# Patient Record
Sex: Male | Born: 1935 | Race: White | Hispanic: No | Marital: Married | Smoking: Never smoker
Health system: Southern US, Community
[De-identification: ages and names within clinical notes are randomized; demographics above are authoritative.]

## PROBLEM LIST (undated history)

## (undated) DIAGNOSIS — I10 Essential (primary) hypertension: Secondary | ICD-10-CM

## (undated) HISTORY — PX: BACK SURGERY: SHX140

## (undated) HISTORY — PX: CATARACT EXTRACTION W/ INTRAOCULAR LENS IMPLANT: SHX1309

## (undated) HISTORY — PX: TONSILLECTOMY: SUR1361

---

## 2004-12-24 ENCOUNTER — Emergency Department: Payer: Self-pay | Admitting: Emergency Medicine

## 2009-11-25 ENCOUNTER — Ambulatory Visit: Payer: Self-pay | Admitting: Internal Medicine

## 2011-07-30 ENCOUNTER — Ambulatory Visit: Payer: Self-pay | Admitting: Gastroenterology

## 2011-08-02 LAB — PATHOLOGY REPORT

## 2016-07-14 ENCOUNTER — Other Ambulatory Visit: Payer: Self-pay | Admitting: Internal Medicine

## 2016-07-14 DIAGNOSIS — M5116 Intervertebral disc disorders with radiculopathy, lumbar region: Secondary | ICD-10-CM

## 2016-07-16 ENCOUNTER — Ambulatory Visit
Admission: RE | Admit: 2016-07-16 | Discharge: 2016-07-16 | Disposition: A | Discharge status: Home / Self Care | Payer: Medicare HMO | Source: Ambulatory Visit | Attending: Internal Medicine | Admitting: Internal Medicine

## 2016-07-16 DIAGNOSIS — M4807 Spinal stenosis, lumbosacral region: Secondary | ICD-10-CM | POA: Diagnosis not present

## 2016-07-16 DIAGNOSIS — M5116 Intervertebral disc disorders with radiculopathy, lumbar region: Secondary | ICD-10-CM

## 2016-07-16 DIAGNOSIS — M48061 Spinal stenosis, lumbar region without neurogenic claudication: Secondary | ICD-10-CM | POA: Insufficient documentation

## 2016-07-16 DIAGNOSIS — M5136 Other intervertebral disc degeneration, lumbar region: Secondary | ICD-10-CM | POA: Diagnosis not present

## 2016-07-16 DIAGNOSIS — M5126 Other intervertebral disc displacement, lumbar region: Secondary | ICD-10-CM | POA: Diagnosis not present

## 2016-08-11 ENCOUNTER — Encounter
Admission: RE | Admit: 2016-08-11 | Discharge: 2016-08-11 | Disposition: A | Discharge status: Home / Self Care | Payer: Medicare HMO | Source: Ambulatory Visit | Attending: Neurosurgery | Admitting: Neurosurgery

## 2016-08-11 ENCOUNTER — Ambulatory Visit
Admission: RE | Admit: 2016-08-11 | Discharge: 2016-08-11 | Disposition: A | Discharge status: Home / Self Care | Payer: Medicare HMO | Source: Ambulatory Visit | Attending: Neurosurgery | Admitting: Neurosurgery

## 2016-08-11 DIAGNOSIS — M479 Spondylosis, unspecified: Secondary | ICD-10-CM | POA: Diagnosis not present

## 2016-08-11 DIAGNOSIS — Z01810 Encounter for preprocedural cardiovascular examination: Secondary | ICD-10-CM | POA: Diagnosis not present

## 2016-08-11 DIAGNOSIS — Z01812 Encounter for preprocedural laboratory examination: Secondary | ICD-10-CM | POA: Insufficient documentation

## 2016-08-11 DIAGNOSIS — Z01818 Encounter for other preprocedural examination: Secondary | ICD-10-CM

## 2016-08-11 HISTORY — DX: Essential (primary) hypertension: I10

## 2016-08-11 LAB — URINALYSIS, COMPLETE (UACMP) WITH MICROSCOPIC
BILIRUBIN URINE: NEGATIVE
Bacteria, UA: NONE SEEN
Glucose, UA: NEGATIVE mg/dL
Hgb urine dipstick: NEGATIVE
Ketones, ur: NEGATIVE mg/dL
LEUKOCYTES UA: NEGATIVE
NITRITE: NEGATIVE
PH: 5 (ref 5.0–8.0)
Protein, ur: NEGATIVE mg/dL
RBC / HPF: NONE SEEN RBC/hpf (ref 0–5)
SPECIFIC GRAVITY, URINE: 1.02 (ref 1.005–1.030)
Squamous Epithelial / LPF: NONE SEEN

## 2016-08-11 LAB — TYPE AND SCREEN
ABO/RH(D): A NEG
Antibody Screen: NEGATIVE

## 2016-08-11 LAB — APTT: APTT: 35 s (ref 24–36)

## 2016-08-11 LAB — SURGICAL PCR SCREEN
MRSA, PCR: NEGATIVE
STAPHYLOCOCCUS AUREUS: POSITIVE — AB

## 2016-08-11 LAB — PROTIME-INR
INR: 1.06
Prothrombin Time: 13.8 seconds (ref 11.4–15.2)

## 2016-08-11 NOTE — Patient Instructions (Signed)
Your procedure is scheduled on: 08/18/16 Wed Report to Same Day Surgery 2nd floor medical mall [facility] Entrance-take elevator on left to 2nd floor.  Check in with surgery information desk.) To find out your arrival time please call [phone] between 1PM - 3PM on 08/17/16 Tues  Remember: Instructions that are not followed completely may result in serious medical risk, up to and including death, or upon the discretion of your surgeon and anesthesiologist your surgery may need to be rescheduled.    _x___ 1. Do not eat food or drink liquids after midnight. No gum chewing or                              hard candies.     __x__ 2. No Alcohol for 24 hours before or after surgery.   __x__3. No Smoking for 24 prior to surgery.   ____  4. Bring all medications with you on the day of surgery if instructed.    __x__ 5. Notify your doctor if there is any change in your medical condition     (cold, fever, infections).     Do not wear jewelry, make-up, hairpins, clips or nail polish.  Do not wear lotions, powders, or perfumes. You may wear deodorant.  Do not shave 48 hours prior to surgery. Men may shave face and neck.  Do not bring valuables to the hospital.    [facility] is not responsible for any belongings or valuables.               Contacts, dentures or bridgework may not be worn into surgery.  Leave your suitcase in the car. After surgery it may be brought to your room.  For patients admitted to the hospital, discharge time is determined by your                       treatment team.   Patients discharged the day of surgery will not be allowed to drive home.  You will need someone to drive you home and stay with you the night of your procedure.    Please read over the following fact sheets that you were given:   [facility] Preparing for Surgery and or MRSA Information   _x___ Take anti-hypertensive (unless it includes a diuretic), cardiac, seizure, asthma,     anti-reflux and  psychiatric medicines. These include:  1. amLODipine (NORVASC  2.losartan (COZAAR  3.traMADol (ULTRAM if needed  4.  5.  6.  ____Fleets enema or Magnesium Citrate as directed.   _x___ Use CHG Soap or sage wipes as directed on instruction sheet   ____ Use inhalers on the day of surgery and bring to hospital day of surgery  ____ Stop Metformin and Janumet 2 days prior to surgery.    ____ Take 1/2 of usual insulin dose the night before surgery and none on the morning     surgery.   _x___ Follow recommendations from Cardiologist, Pulmonologist or PCP regarding          stopping Aspirin, Coumadin, Pllavix ,Eliquis, Effient, or Pradaxa, and Pletal.  X____Stop Anti-inflammatories such as Advil, Aleve, Ibuprofen, Motrin, Naproxen, Naprosyn, Goodies powders or aspirin products. OK to take Tylenol and                          Celebrex.   _x___ Stop supplements until after surgery.  But may  continue Vitamin D, Vitamin B,       and multivitamin.   ____ Bring C-Pap to the hospital.

## 2016-08-11 NOTE — Pre-Procedure Instructions (Signed)
Positive staph aureus results sent to Dr. Izora Ribas for review, asked if wanted any treatment?

## 2016-08-13 DIAGNOSIS — Z888 Allergy status to other drugs, medicaments and biological substances status: Secondary | ICD-10-CM | POA: Diagnosis not present

## 2016-08-13 DIAGNOSIS — M48062 Spinal stenosis, lumbar region with neurogenic claudication: Secondary | ICD-10-CM | POA: Diagnosis present

## 2016-08-13 DIAGNOSIS — Z9841 Cataract extraction status, right eye: Secondary | ICD-10-CM | POA: Diagnosis not present

## 2016-08-13 DIAGNOSIS — E785 Hyperlipidemia, unspecified: Secondary | ICD-10-CM | POA: Diagnosis not present

## 2016-08-13 DIAGNOSIS — Z9842 Cataract extraction status, left eye: Secondary | ICD-10-CM | POA: Diagnosis not present

## 2016-08-13 DIAGNOSIS — Z79899 Other long term (current) drug therapy: Secondary | ICD-10-CM | POA: Diagnosis not present

## 2016-08-13 DIAGNOSIS — Z881 Allergy status to other antibiotic agents status: Secondary | ICD-10-CM | POA: Diagnosis not present

## 2016-08-13 DIAGNOSIS — I10 Essential (primary) hypertension: Secondary | ICD-10-CM | POA: Diagnosis not present

## 2016-08-13 DIAGNOSIS — Z7982 Long term (current) use of aspirin: Secondary | ICD-10-CM | POA: Diagnosis not present

## 2016-08-13 DIAGNOSIS — Z85828 Personal history of other malignant neoplasm of skin: Secondary | ICD-10-CM | POA: Diagnosis not present

## 2016-08-13 NOTE — Progress Notes (Addendum)
[place] NOTE  Pharmacy consult to dose vancomycin and Ancef in this 59 yoM for surgical prophylaxis  Plan: Due to body weight being < 100 kg, will give vancomycin 1000 mg IV x 1 dose for surgical prophylaxis on 5/9  Ancef 1 gm on call to OR x 1  Allergies  Allergen Reactions  . Erythromycin Other (See Comments)    Liver damage  . Indomethacin Other (See Comments)    Severe headche  . Simvastatin Other (See Comments)    Muscle pain      Labs:  Recent Labs  08/11/16 1123  APTT 35   CrCl cannot be calculated (No order found.).   Microbiology: Recent Results (from the past 720 hour(s))  Surgical pcr screen     Status: Abnormal   Collection Time: 08/11/16 11:23 AM  Result Value Ref Range Status   MRSA, PCR NEGATIVE NEGATIVE Final   Staphylococcus aureus POSITIVE (A) NEGATIVE Final    Comment:        The Xpert SA Assay (FDA approved for NASAL specimens in patients over 2 years of age), is one component of a comprehensive surveillance program.  Test performance has been validated by [facility] for patients greater than or equal to 66 year old. It is not intended to diagnose infection nor to guide or monitor treatment.     Medical History: Past Medical History:  Diagnosis Date  . Hypertension     Darrow Bussing, PharmD Pharmacy Resident 08/13/2016 4:23 PM

## 2016-08-18 ENCOUNTER — Ambulatory Visit: Payer: Medicare HMO | Admitting: Anesthesiology

## 2016-08-18 ENCOUNTER — Ambulatory Visit
Admission: RE | Admit: 2016-08-18 | Discharge: 2016-08-18 | Disposition: A | Discharge status: Home / Self Care | Payer: Medicare HMO | Source: Ambulatory Visit | Attending: Neurosurgery | Admitting: Neurosurgery

## 2016-08-18 ENCOUNTER — Ambulatory Visit: Payer: Medicare HMO

## 2016-08-18 ENCOUNTER — Encounter
Admission: RE | Disposition: A | Discharge status: Home / Self Care | Payer: Self-pay | Source: Ambulatory Visit | Attending: Neurosurgery

## 2016-08-18 DIAGNOSIS — Z9842 Cataract extraction status, left eye: Secondary | ICD-10-CM | POA: Insufficient documentation

## 2016-08-18 DIAGNOSIS — Z881 Allergy status to other antibiotic agents status: Secondary | ICD-10-CM | POA: Insufficient documentation

## 2016-08-18 DIAGNOSIS — Z79899 Other long term (current) drug therapy: Secondary | ICD-10-CM | POA: Insufficient documentation

## 2016-08-18 DIAGNOSIS — M48062 Spinal stenosis, lumbar region with neurogenic claudication: Secondary | ICD-10-CM | POA: Diagnosis not present

## 2016-08-18 DIAGNOSIS — I10 Essential (primary) hypertension: Secondary | ICD-10-CM | POA: Insufficient documentation

## 2016-08-18 DIAGNOSIS — E785 Hyperlipidemia, unspecified: Secondary | ICD-10-CM | POA: Insufficient documentation

## 2016-08-18 DIAGNOSIS — Z85828 Personal history of other malignant neoplasm of skin: Secondary | ICD-10-CM | POA: Insufficient documentation

## 2016-08-18 DIAGNOSIS — Z7982 Long term (current) use of aspirin: Secondary | ICD-10-CM | POA: Insufficient documentation

## 2016-08-18 DIAGNOSIS — Z9841 Cataract extraction status, right eye: Secondary | ICD-10-CM | POA: Insufficient documentation

## 2016-08-18 DIAGNOSIS — Z888 Allergy status to other drugs, medicaments and biological substances status: Secondary | ICD-10-CM | POA: Insufficient documentation

## 2016-08-18 DIAGNOSIS — Z419 Encounter for procedure for purposes other than remedying health state, unspecified: Secondary | ICD-10-CM

## 2016-08-18 HISTORY — PX: LUMBAR LAMINECTOMY/DECOMPRESSION MICRODISCECTOMY: SHX5026

## 2016-08-18 LAB — ABO/RH: ABO/RH(D): A NEG

## 2016-08-18 SURGERY — LUMBAR LAMINECTOMY/DECOMPRESSION MICRODISCECTOMY 2 LEVELS
Anesthesia: General | Site: Spine Lumbar | Laterality: Left | Wound class: Clean

## 2016-08-18 MED ORDER — LIDOCAINE HCL (CARDIAC) 20 MG/ML IV SOLN
INTRAVENOUS | Status: DC | PRN
  Administered 2016-08-18: 100 mg via INTRAVENOUS

## 2016-08-18 MED ORDER — LIDOCAINE HCL (PF) 2 % IJ SOLN
INTRAMUSCULAR | Status: AC
  Filled 2016-08-18: qty 2

## 2016-08-18 MED ORDER — SUGAMMADEX SODIUM 200 MG/2ML IV SOLN
INTRAVENOUS | Status: AC
  Filled 2016-08-18: qty 2

## 2016-08-18 MED ORDER — CEFAZOLIN SODIUM-DEXTROSE 1-4 GM/50ML-% IV SOLN
INTRAVENOUS | Status: AC
  Filled 2016-08-18: qty 50

## 2016-08-18 MED ORDER — CEFAZOLIN SODIUM-DEXTROSE 1-4 GM/50ML-% IV SOLN: 1.0000 g | INTRAVENOUS | Status: DC

## 2016-08-18 MED ORDER — BUPIVACAINE LIPOSOME 1.3 % IJ SUSP
INTRAMUSCULAR | Status: DC | PRN
  Administered 2016-08-18: 20 mL

## 2016-08-18 MED ORDER — OXYCODONE-ACETAMINOPHEN 5-325 MG PO TABS: 1.0000 | ORAL_TABLET | ORAL | 0 refills | Status: DC | PRN

## 2016-08-18 MED ORDER — ONDANSETRON HCL 4 MG/2ML IJ SOLN: 4.0000 mg | Freq: Once | INTRAMUSCULAR | Status: DC | PRN

## 2016-08-18 MED ORDER — SODIUM CHLORIDE 0.9 % IJ SOLN
INTRAMUSCULAR | Status: AC
  Filled 2016-08-18: qty 10

## 2016-08-18 MED ORDER — SUCCINYLCHOLINE CHLORIDE 20 MG/ML IJ SOLN
INTRAMUSCULAR | Status: AC
  Filled 2016-08-18: qty 1

## 2016-08-18 MED ORDER — GELATIN ABSORBABLE 12-7 MM EX MISC
CUTANEOUS | Status: DC | PRN
  Administered 2016-08-18: 1

## 2016-08-18 MED ORDER — GELATIN ABSORBABLE 12-7 MM EX MISC
CUTANEOUS | Status: AC
  Filled 2016-08-18: qty 1

## 2016-08-18 MED ORDER — METHOCARBAMOL 750 MG PO TABS: 750.0000 mg | ORAL_TABLET | Freq: Four times a day (QID) | ORAL | 0 refills | Status: DC | PRN

## 2016-08-18 MED ORDER — FENTANYL CITRATE (PF) 100 MCG/2ML IJ SOLN
INTRAMUSCULAR | Status: AC
  Filled 2016-08-18: qty 2

## 2016-08-18 MED ORDER — ACETAMINOPHEN 10 MG/ML IV SOLN
INTRAVENOUS | Status: AC
  Filled 2016-08-18: qty 100

## 2016-08-18 MED ORDER — PROPOFOL 10 MG/ML IV BOLUS
INTRAVENOUS | Status: AC
  Filled 2016-08-18: qty 20

## 2016-08-18 MED ORDER — FENTANYL CITRATE (PF) 100 MCG/2ML IJ SOLN
25.0000 ug | INTRAMUSCULAR | Status: DC | PRN
  Administered 2016-08-18 (×2): 25 ug via INTRAVENOUS

## 2016-08-18 MED ORDER — SODIUM CHLORIDE 0.9 % IJ SOLN
INTRAMUSCULAR | Status: AC
  Filled 2016-08-18: qty 20

## 2016-08-18 MED ORDER — BUPIVACAINE LIPOSOME 1.3 % IJ SUSP
INTRAMUSCULAR | Status: AC
  Filled 2016-08-18: qty 20

## 2016-08-18 MED ORDER — DEXAMETHASONE SODIUM PHOSPHATE 10 MG/ML IJ SOLN
INTRAMUSCULAR | Status: AC
  Filled 2016-08-18: qty 1

## 2016-08-18 MED ORDER — FAMOTIDINE 20 MG PO TABS
ORAL_TABLET | ORAL | Status: AC
  Administered 2016-08-18: 20 mg via ORAL
  Filled 2016-08-18: qty 1

## 2016-08-18 MED ORDER — EPHEDRINE SULFATE 50 MG/ML IJ SOLN
INTRAMUSCULAR | Status: AC
  Filled 2016-08-18: qty 1

## 2016-08-18 MED ORDER — LACTATED RINGERS IV SOLN
INTRAVENOUS | Status: DC
  Administered 2016-08-18 (×3): via INTRAVENOUS

## 2016-08-18 MED ORDER — PROPOFOL 10 MG/ML IV BOLUS
INTRAVENOUS | Status: DC | PRN
  Administered 2016-08-18: 140 mg via INTRAVENOUS

## 2016-08-18 MED ORDER — PHENYLEPHRINE HCL 10 MG/ML IJ SOLN
INTRAMUSCULAR | Status: DC | PRN
  Administered 2016-08-18 (×3): 100 ug via INTRAVENOUS

## 2016-08-18 MED ORDER — ROCURONIUM BROMIDE 100 MG/10ML IV SOLN
INTRAVENOUS | Status: DC | PRN
  Administered 2016-08-18: 10 mg via INTRAVENOUS
  Administered 2016-08-18: 20 mg via INTRAVENOUS
  Administered 2016-08-18: 40 mg via INTRAVENOUS

## 2016-08-18 MED ORDER — VANCOMYCIN HCL IN DEXTROSE 1-5 GM/200ML-% IV SOLN
INTRAVENOUS | Status: AC
  Filled 2016-08-18: qty 200

## 2016-08-18 MED ORDER — DEXAMETHASONE SODIUM PHOSPHATE 10 MG/ML IJ SOLN
INTRAMUSCULAR | Status: DC | PRN
  Administered 2016-08-18: 10 mg via INTRAVENOUS

## 2016-08-18 MED ORDER — FENTANYL CITRATE (PF) 100 MCG/2ML IJ SOLN
INTRAMUSCULAR | Status: AC
  Administered 2016-08-18: 25 ug via INTRAVENOUS
  Filled 2016-08-18: qty 2

## 2016-08-18 MED ORDER — METHYLPREDNISOLONE 4 MG PO TBPK: ORAL_TABLET | ORAL | 0 refills | Status: DC

## 2016-08-18 MED ORDER — VANCOMYCIN HCL IN DEXTROSE 1-5 GM/200ML-% IV SOLN
INTRAVENOUS | Status: AC
  Administered 2016-08-18: 1000 mg via INTRAVENOUS
  Filled 2016-08-18: qty 200

## 2016-08-18 MED ORDER — FAMOTIDINE 20 MG PO TABS
20.0000 mg | ORAL_TABLET | Freq: Once | ORAL | Status: AC
  Administered 2016-08-18: 20 mg via ORAL

## 2016-08-18 MED ORDER — PHENYLEPHRINE HCL 10 MG/ML IJ SOLN
INTRAMUSCULAR | Status: AC
  Filled 2016-08-18: qty 1

## 2016-08-18 MED ORDER — BACITRACIN 50000 UNITS IM SOLR
INTRAMUSCULAR | Status: DC | PRN
  Administered 2016-08-18: 50000 [IU]

## 2016-08-18 MED ORDER — BUPIVACAINE-EPINEPHRINE (PF) 0.5% -1:200000 IJ SOLN
INTRAMUSCULAR | Status: DC | PRN
  Administered 2016-08-18: 30 mL

## 2016-08-18 MED ORDER — METHYLPREDNISOLONE ACETATE 40 MG/ML IJ SUSP
INTRAMUSCULAR | Status: AC
  Filled 2016-08-18: qty 1

## 2016-08-18 MED ORDER — THROMBIN 5000 UNITS EX SOLR
CUTANEOUS | Status: AC
  Filled 2016-08-18: qty 5000

## 2016-08-18 MED ORDER — BACITRACIN 50000 UNITS IM SOLR
INTRAMUSCULAR | Status: AC
  Filled 2016-08-18: qty 1

## 2016-08-18 MED ORDER — VANCOMYCIN HCL IN DEXTROSE 1-5 GM/200ML-% IV SOLN
1000.0000 mg | INTRAVENOUS | Status: AC
  Administered 2016-08-18: 1000 mg via INTRAVENOUS

## 2016-08-18 MED ORDER — ONDANSETRON HCL 4 MG/2ML IJ SOLN
INTRAMUSCULAR | Status: AC
  Filled 2016-08-18: qty 2

## 2016-08-18 MED ORDER — METHYLPREDNISOLONE ACETATE 40 MG/ML IJ SUSP
INTRAMUSCULAR | Status: DC | PRN
  Administered 2016-08-18: 40 mg

## 2016-08-18 MED ORDER — ROCURONIUM BROMIDE 50 MG/5ML IV SOLN
INTRAVENOUS | Status: AC
  Filled 2016-08-18: qty 1

## 2016-08-18 MED ORDER — ACETAMINOPHEN 10 MG/ML IV SOLN
INTRAVENOUS | Status: DC | PRN
  Administered 2016-08-18: 1000 mg via INTRAVENOUS

## 2016-08-18 MED ORDER — FENTANYL CITRATE (PF) 100 MCG/2ML IJ SOLN
INTRAMUSCULAR | Status: DC | PRN
  Administered 2016-08-18: 100 ug via INTRAVENOUS
  Administered 2016-08-18: 50 ug via INTRAVENOUS

## 2016-08-18 MED ORDER — SUCCINYLCHOLINE CHLORIDE 20 MG/ML IJ SOLN
INTRAMUSCULAR | Status: DC | PRN
  Administered 2016-08-18: 100 mg via INTRAVENOUS

## 2016-08-18 MED ORDER — ONDANSETRON HCL 4 MG/2ML IJ SOLN
INTRAMUSCULAR | Status: DC | PRN
  Administered 2016-08-18: 4 mg via INTRAVENOUS

## 2016-08-18 MED ORDER — BUPIVACAINE-EPINEPHRINE (PF) 0.5% -1:200000 IJ SOLN
INTRAMUSCULAR | Status: AC
  Filled 2016-08-18: qty 30

## 2016-08-18 SURGICAL SUPPLY — 64 items
BAND RUBBER 3X1/6 TAN STRL (MISCELLANEOUS) IMPLANT
BLADE BOVIE TIP EXT 4 (BLADE) ×3 IMPLANT
BUR NEURO DRILL SOFT 3.0X3.8M (BURR) ×3 IMPLANT
CANISTER SUCT 1200ML W/VALVE (MISCELLANEOUS) ×3 IMPLANT
CHLORAPREP W/TINT 26ML (MISCELLANEOUS) ×6 IMPLANT
CNTNR SPEC 2.5X3XGRAD LEK (MISCELLANEOUS) ×1
CONT SPEC 4OZ STER OR WHT (MISCELLANEOUS) ×2
CONTAINER SPEC 2.5X3XGRAD LEK (MISCELLANEOUS) ×1 IMPLANT
COUNTER NEEDLE 20/40 LG (NEEDLE) ×3 IMPLANT
COVER LIGHT HANDLE STERIS (MISCELLANEOUS) ×6 IMPLANT
CUP MEDICINE 2OZ PLAST GRAD ST (MISCELLANEOUS) ×3 IMPLANT
DERMABOND ADVANCED (GAUZE/BANDAGES/DRESSINGS) ×2
DERMABOND ADVANCED .7 DNX12 (GAUZE/BANDAGES/DRESSINGS) ×1 IMPLANT
DRAPE C-ARM 42X72 X-RAY (DRAPES) ×6 IMPLANT
DRAPE LAPAROTOMY 100X77 ABD (DRAPES) ×3 IMPLANT
DRAPE MICROSCOPE SPINE 48X150 (DRAPES) ×3 IMPLANT
DRAPE POUCH INSTRU U-SHP 10X18 (DRAPES) ×3 IMPLANT
DRAPE SURG 17X11 SM STRL (DRAPES) ×12 IMPLANT
DRSG TEGADERM 4X4.75 (GAUZE/BANDAGES/DRESSINGS) IMPLANT
DRSG TELFA 4X3 1S NADH ST (GAUZE/BANDAGES/DRESSINGS) ×2 IMPLANT
DURASEAL APPLICATOR TIP (TIP) ×3 IMPLANT
DURASEAL SPINE SEALANT 3ML (MISCELLANEOUS) ×3 IMPLANT
ELECT CAUTERY BLADE TIP 2.5 (TIP) ×3
ELECT EZSTD 165MM 6.5IN (MISCELLANEOUS) ×3
ELECTRODE CAUTERY BLDE TIP 2.5 (TIP) ×1 IMPLANT
ELECTRODE EZSTD 165MM 6.5IN (MISCELLANEOUS) ×1 IMPLANT
FRAME EYE SHIELD (PROTECTIVE WEAR) ×3 IMPLANT
GLOVE SURG SYN 8.5  E (GLOVE) ×6
GLOVE SURG SYN 8.5 E (GLOVE) ×3 IMPLANT
GOWN STRL REUS W/ TWL LRG LVL3 (GOWN DISPOSABLE) ×1 IMPLANT
GOWN STRL REUS W/TWL LRG LVL3 (GOWN DISPOSABLE) ×2
GOWN STRL XL  DISP (MISCELLANEOUS) ×3 IMPLANT
GRADUATE 1200CC STRL 31836 (MISCELLANEOUS) ×3 IMPLANT
GRAFT DURAGEN MATRIX 1WX1L (Tissue) ×3 IMPLANT
IV CATH ANGIO 12GX3 LT BLUE (NEEDLE) ×3 IMPLANT
KIT SPINAL PRONEVIEW (KITS) ×2 IMPLANT
KNIFE BAYONET SHORT DISCETOMY (MISCELLANEOUS) IMPLANT
MARKER SKIN DUAL TIP RULER LAB (MISCELLANEOUS) ×6 IMPLANT
NDL SAFETY ECLIPSE 18X1.5 (NEEDLE) ×1 IMPLANT
NEEDLE HYPO 18GX1.5 SHARP (NEEDLE) ×2
NEEDLE HYPO 22GX1.5 SAFETY (NEEDLE) ×3 IMPLANT
NS IRRIG 1000ML POUR BTL (IV SOLUTION) ×3 IMPLANT
PACK LAMINECTOMY NEURO (CUSTOM PROCEDURE TRAY) ×3 IMPLANT
PAD ARMBOARD 7.5X6 YLW CONV (MISCELLANEOUS) ×3 IMPLANT
PATTIES SURGICAL .5X1.5 (GAUZE/BANDAGES/DRESSINGS) IMPLANT
SPOGE SURGIFLO 8M (HEMOSTASIS) ×2
SPONGE SURGIFLO 8M (HEMOSTASIS) ×1 IMPLANT
STAPLER SKIN PROX 35W (STAPLE) IMPLANT
SUT DVC VLOC 3-0 CL 6 P-12 (SUTURE) ×6 IMPLANT
SUT NURALON 4 0 TR CR/8 (SUTURE) IMPLANT
SUT VIC AB 0 CT1 27 (SUTURE) ×6
SUT VIC AB 0 CT1 27XCR 8 STRN (SUTURE) ×3 IMPLANT
SUT VIC AB 2-0 CT1 18 (SUTURE) ×9 IMPLANT
SUT VIC AB 3-0 SH 27 (SUTURE) ×2
SUT VIC AB 3-0 SH 27X BRD (SUTURE) ×1 IMPLANT
SUT VICRYL 0 UR6 27IN ABS (SUTURE) ×3 IMPLANT
SYR 20CC LL (SYRINGE) ×3 IMPLANT
SYR 30ML LL (SYRINGE) ×3 IMPLANT
SYRINGE 10CC LL (SYRINGE) ×3 IMPLANT
TOWEL OR 17X26 4PK STRL BLUE (TOWEL DISPOSABLE) ×6 IMPLANT
TUBE MATRX SPINL 18MM 6CM DISP (INSTRUMENTS) ×1
TUBE METRX SPINAL 18X6 DISP (INSTRUMENTS) ×1 IMPLANT
TUBING CONNECTING 10 (TUBING) ×2 IMPLANT
TUBING CONNECTING 10' (TUBING) ×1

## 2016-08-18 NOTE — Progress Notes (Signed)
Pt got up and walked to bathroom and back without difficulty

## 2016-08-18 NOTE — Anesthesia Preprocedure Evaluation (Signed)
Anesthesia Evaluation  Patient identified by MRN, date of birth, ID band Patient awake    Reviewed: Allergy & Precautions, NPO status , Patient's Chart, lab work & pertinent test results, reviewed documented beta blocker date and time   Airway Mallampati: II  TM Distance: >3 FB     Dental  (+) Chipped   Pulmonary           Cardiovascular hypertension, Pt. on medications      Neuro/Psych    GI/Hepatic   Endo/Other    Renal/GU      Musculoskeletal   Abdominal   Peds  Hematology   Anesthesia Other Findings   Reproductive/Obstetrics                             Anesthesia Physical Anesthesia Plan  ASA: III  Anesthesia Plan: General   Post-op Pain Management:    Induction: Intravenous  Airway Management Planned: Oral ETT  Additional Equipment:   Intra-op Plan:   Post-operative Plan:   Informed Consent: I have reviewed the patients History and Physical, chart, labs and discussed the procedure including the risks, benefits and alternatives for the proposed anesthesia with the patient or authorized representative who has indicated his/her understanding and acceptance.     Plan Discussed with: CRNA  Anesthesia Plan Comments:         Anesthesia Quick Evaluation

## 2016-08-18 NOTE — Anesthesia Postprocedure Evaluation (Signed)
Anesthesia Post Note  Patient: Jeffrey Baker  Procedure(s) Performed: Procedure(s) (LRB): LUMBAR LAMINECTOMY/DECOMPRESSION MICRODISCECTOMY 2 LEVELS L4-S1 (Left)  Patient location during evaluation: PACU Anesthesia Type: General Level of consciousness: awake and alert Pain management: pain level controlled Vital Signs Assessment: post-procedure vital signs reviewed and stable Respiratory status: spontaneous breathing, nonlabored ventilation, respiratory function stable and patient connected to nasal cannula oxygen Cardiovascular status: blood pressure returned to baseline and stable Postop Assessment: no signs of nausea or vomiting Anesthetic complications: no     Last Vitals:  Vitals:   08/18/16 1047 08/18/16 1100  BP: (!) 153/83 135/75  Pulse: 86 78  Resp: 16   Temp: (!) 35.9 C     Last Pain:  Vitals:   08/18/16 1047  TempSrc: Tympanic  PainSc:                  [name] S

## 2016-08-18 NOTE — Progress Notes (Signed)
Called Dr. Aurelio Brash and states ok for discharge

## 2016-08-18 NOTE — Progress Notes (Signed)
Voided x2 

## 2016-08-18 NOTE — H&P (Signed)
I have reviewed and confirmed my history and physical from 07/29/2016 with no additions or changes. Plan for L4-S1 decompression.  Risks and benefits reviewed.  Heart sounds normal no MRG. Chest clear to auscultation bilaterally.

## 2016-08-18 NOTE — Progress Notes (Signed)
No complaints of pain.

## 2016-08-18 NOTE — Progress Notes (Signed)
Voided 200 cc.

## 2016-08-18 NOTE — Op Note (Signed)
Indications: lumbar stenosis with neurogenic claudication  Findings: lumbar stenosis  Preoperative Diagnosis: Lumbar Stenosis with neurogenic claudication Postoperative Diagnosis: same   EBL: 25 ml IVF: 1000 ml Drains: none Disposition: Extubated and Stable to PACU Complications: none  No foley catheter was placed.   Preoperative Note:   Risks of surgery discussed include: infection, bleeding, stroke, coma, death, paralysis, CSF leak, nerve/spinal cord injury, numbness, tingling, weakness, complex regional pain syndrome, recurrent stenosis and/or disc herniation, vascular injury, development of instability, neck/back pain, need for further surgery, persistent symptoms, development of deformity, and the risks of anesthesia. They understood these risks and have agreed to proceed.  Operative Note:   1. L4-5 and L5-S1 lumbar decompression including central laminectomy and left L4-5 and L5-S1 medial facetectomies including foraminotomies  The patient was then brought from the preoperative center with intravenous access established.  The patient underwent general anesthesia and endotracheal tube intubation, and was then rotated on the [place] rail top where all pressure points were appropriately padded.  The skin was then thoroughly cleansed.  Perioperative antibiotic prophylaxis was administered.  Sterile prep and drapes were then applied and a timeout was then observed.  C-arm was brought into the field under sterile conditions and under lateral visualization the L4-5 interspace was identified and marked.  The incision was marked on the left and injected with local anesthetic. Once this was complete a 2 cm incision was opened with the use of a #10 blade knife.  The metrx tubes were sequentially advanced and confirmed in position. An 13mm by 58mm tube was locked in place to the bed side attachment.  Fluoroscopy was then removed from the field.  The microscope was then sterilely brought into the  field and muscle creep was hemostased with a bipolar and resected with a pituitary rongeur.  A Bovie extender was then used to expose the spinous process and lamina.  Careful attention was placed to not violate the facet capsule. A 3 mm matchstick drill bit was then used to make a hemi-laminotomy trough until the ligamentum flavum was exposed.  This was extended to the base of the spinous process and to the contralateral side to remove all the central bone from each side.  Once this was complete and the underlying ligamentum flavum was visualized, it was dissected with a curette and resected with Kerrison rongeurs.  Extensive ligamentum hypertrophy was noted, requiring a substantial amount of time and care for removal.  The dura was identified and palpated. The kerrison rongeur was then used to remove the medial facet bilaterally until no compression was noted.  A balltip probe was used to confirm decompression of the left L5 nerve root.    Once this was complete, L4-5 central decompression including left medial facetectomy and foraminotomy was confirmed and decompression on both sides was confirmed. No CSF leak was noted.   A Depo-Medrol soaked Gelfoam pledget was placed in the defect.  The wound was copiously irrigated. The tube system was then removed under microscopic visualization and hemostasis was obtained with a bipolar.      The metrx tubes were sequentially advanced and confirmed in position at L5-S1. An 52mm by 52mm tube was locked in place to the bed side attachment.  Fluoroscopy was then removed from the field.  The microscope was then sterilely brought into the field and muscle creep was hemostased with a bipolar and resected with a pituitary rongeur.  A Bovie extender was then used to expose the spinous process and lamina.  Careful  attention was placed to not violate the facet capsule. A 3 mm matchstick drill bit was then used to make a hemi-laminotomy trough until the ligamentum flavum was  exposed.  This was extended to the base of the spinous process and to the contralateral side to remove all the central bone from each side.  Once this was complete and the underlying ligamentum flavum was visualized, it was dissected with a curette and resected with Kerrison rongeurs.  Extensive ligamentum hypertrophy was noted, requiring a substantial amount of time and care for removal.  The dura was identified and palpated. The kerrison rongeur was then used to remove the medial facet bilaterally until no compression was noted.  A balltip probe was used to confirm decompression of the left S1 nerve root.    Once this was complete, L5-S1 central decompression including left medial facetectomy and foraminotomy was confirmed and decompression on both sides was confirmed. No CSF leak was noted.  A Depo-Medrol soaked Gelfoam pledget was placed in the defect.  The wound was copiously irrigated. The tube system was then removed under microscopic visualization and hemostasis was obtained with a bipolar.    The fascial layer was reapproximated with the use of a 0 Vicryl suture.  Subcutaneous tissue layer was reapproximated using 2-0 Vicryl suture.  3-0 monocryl was placed in subcuticular fashion. The skin was then cleansed and Dermabond was used to close the skin opening.  Patient was then rotated back to the preoperative bed awakened from anesthesia and taken to recovery all counts are correct in this case.  I performed the entire procedure.  Carsen Machi K. Izora Ribas MD

## 2016-08-18 NOTE — Transfer of Care (Signed)
Immediate Anesthesia Transfer of Care Note  Patient: Jeffrey Baker  Procedure(s) Performed: Procedure(s): LUMBAR LAMINECTOMY/DECOMPRESSION MICRODISCECTOMY 2 LEVELS L4-S1 (Left)  Patient Location: PACU  Anesthesia Type:General  Level of Consciousness: sedated  Airway & Oxygen Therapy: Patient Spontanous Breathing and Patient connected to face mask oxygen  Post-op Assessment: Report given to RN and Post -op Vital signs reviewed and stable  Post vital signs: Reviewed and stable  Last Vitals:  Vitals:   08/18/16 0603  BP: (!) 141/72  Pulse: 68  Resp: 16  Temp: 36.6 C    Last Pain:  Vitals:   08/18/16 0603  TempSrc: Tympanic  PainSc: 5          Complications: No apparent anesthesia complications

## 2016-08-18 NOTE — Anesthesia Post-op Follow-up Note (Cosign Needed)
Anesthesia QCDR form completed.        

## 2016-08-18 NOTE — Discharge Instructions (Addendum)
Your surgeon has performed an operation on your lumbar spine (low back) to relieve pressure on one or more nerves. Many times, patients feel better immediately after surgery and can overdo it. Even if you feel well, it is important that you follow these activity guidelines. If you do not let your back heal properly from the surgery, you can increase the chance of a disc herniation and return of your symptoms. The following are instructions to help in your recovery once you have been discharged from the hospital.  * Do not take anti-inflammatory medications for 5 days after surgery (naproxen [Aleve], ibuprofen [Advil, Motrin], celecoxib [Celebrex], etc.)  Activity    No bending, lifting, or twisting (BLT). Avoid lifting objects heavier than 10 pounds (gallon milk jug).  Where possible, avoid household activities that involve lifting, bending, pushing, or pulling such as laundry, vacuuming, grocery shopping, and childcare. Try to arrange for help from friends and family for these activities while your back heals.  Increase physical activity slowly as tolerated.  Taking short walks is encouraged, but avoid strenuous exercise. Do not jog, run, bicycle, lift weights, or participate in any other exercises unless specifically allowed by your doctor. Avoid prolonged sitting, including car rides.  Talk to your doctor before resuming sexual activity.  You should not drive until cleared by your doctor.  Until released by your doctor, you should not return to work or school.  You should rest at home and let your body heal.   You may shower three days after your surgery.  After showering, lightly dab your incision dry. Do not take a tub bath or go swimming until approved by your doctor at your follow-up appointment.  If you smoke, we strongly recommend that you quit.  Smoking has been proven to interfere with normal healing in your back and will dramatically reduce the success rate of your surgery. Please  contact [facility] (800-QUIT-NOW) and use the resources at www.[facility].com for assistance in stopping smoking.  Surgical Incision   If you have a dressing on your incision, you may remove it two days after your surgery. Keep your incision area clean and dry.  If you have staples or stitches on your incision, you should have a follow up scheduled for removal. If you do not have staples or stitches, you will have steri-strips (small pieces of surgical tape) or Dermabond glue. The steri-strips/glue should begin to peel away within about a week (it is fine if the steri-strips fall off before then). If the strips are still in place one week after your surgery, you may gently remove them.  Diet            You may return to your usual diet. Be sure to stay hydrated.  When to Contact us  Although your surgery and recovery will likely be uneventful, you may have some residual numbness, aches, and pains in your back and/or legs. This is normal and should improve in the next few weeks.  However, should you experience any of the following, contact us immediately:  New numbness or weakness  Pain that is progressively getting worse, and is not relieved by your pain medications or rest  Bleeding, redness, swelling, pain, or drainage from surgical incision  Chills or flu-like symptoms  Fever greater than 101.0 F (38.3 C)  Problems with bowel or bladder functions  Difficulty breathing or shortness of breath  Warmth, tenderness, or swelling in your calf  Contact Information  During office hours (Monday-Friday 9 am to 5  pm), please call your physician at [phone]  After hours and weekends, please call the [place] Operator at [phone] and ask for the Neurosurgery Resident On Call   For a life-threatening emergency, call [place]   1) The drugs that you were given will stay in your system until tomorrow so for the next 24 hours you should  not:  A) Drive an automobile B) Make any legal decisions C) Drink any alcoholic beverage   2) You may resume regular meals tomorrow.  Today it is better to start with liquids and gradually work up to solid foods.  You may eat anything you prefer, but it is better to start with liquids, then soup and crackers, and gradually work up to solid foods.   3) Please notify your doctor immediately if you have any unusual bleeding, trouble breathing, redness and pain at the surgery site, drainage, fever, or pain not relieved by medication.    4) Additional Instructions:        Please contact your physician with any problems or Same Day Surgery at [phone], Monday through Friday 6 am to 4 pm, or  at [facility] number at [phone].

## 2016-08-18 NOTE — Progress Notes (Signed)
    Attending Progress Note  History: Jeffrey Baker is here for lumbar spondylosis and neurogenic claudication. He is doing well after L4-S1 decomrpession  Physical Exam: Vitals:   08/18/16 1006 08/18/16 1012  BP: 122/69   Pulse: 70 73  Resp: 16 14  Temp:      AA Ox3 CNI  Strength:5/5 throughout BLE  Incision c.d.i  Data:  No results for input(s): NA, K, CL, CO2, BUN, CREATININE, LABGLOM, GLUCOSE, CALCIUM in the last 168 hours. No results for input(s): AST, ALT, ALKPHOS in the last 168 hours.  Invalid input(s): TBILI   No results for input(s): WBC, HGB, HCT, PLT in the last 168 hours.  Recent Labs Lab 08/11/16 1123  APTT 35  INR 1.06         Other tests/results: none  Assessment/Plan:  [name] is doing well after surgery  - mobilize - pain control - advance diet - may go home if able  Meade Maw MD, [facility] Department of Neurosurgery

## 2016-08-18 NOTE — Progress Notes (Signed)
Dressing dry and intact to lower back

## 2016-08-18 NOTE — Anesthesia Procedure Notes (Signed)
Procedure Name: Intubation Date/Time: 08/18/2016 8:09 AM Performed by: Nelda Marseille Pre-anesthesia Checklist: Patient identified, Patient being monitored, Timeout performed, Emergency Drugs available and Suction available Patient Re-evaluated:Patient Re-evaluated prior to inductionOxygen Delivery Method: Circle system utilized Preoxygenation: Pre-oxygenation with 100% oxygen Intubation Type: IV induction Ventilation: Mask ventilation without difficulty Laryngoscope Size: Mac and 3 Grade View: Grade I Tube type: Oral Tube size: 7.5 mm Number of attempts: 1 Airway Equipment and Method: Stylet Placement Confirmation: ETT inserted through vocal cords under direct vision,  positive ETCO2 and breath sounds checked- equal and bilateral Secured at: 22 cm Tube secured with: Tape Dental Injury: Teeth and Oropharynx as per pre-operative assessment

## 2016-09-01 ENCOUNTER — Encounter: Payer: Self-pay | Admitting: Neurosurgery

## 2017-02-12 ENCOUNTER — Emergency Department
Admission: EM | Admit: 2017-02-12 | Discharge: 2017-02-12 | Disposition: A | Discharge status: Home / Self Care | Payer: Medicare HMO | Attending: Emergency Medicine | Admitting: Emergency Medicine

## 2017-02-12 DIAGNOSIS — Z7982 Long term (current) use of aspirin: Secondary | ICD-10-CM | POA: Diagnosis not present

## 2017-02-12 DIAGNOSIS — Y999 Unspecified external cause status: Secondary | ICD-10-CM | POA: Diagnosis not present

## 2017-02-12 DIAGNOSIS — Z23 Encounter for immunization: Secondary | ICD-10-CM | POA: Diagnosis not present

## 2017-02-12 DIAGNOSIS — W450XXA Nail entering through skin, initial encounter: Secondary | ICD-10-CM | POA: Diagnosis not present

## 2017-02-12 DIAGNOSIS — Z79899 Other long term (current) drug therapy: Secondary | ICD-10-CM | POA: Insufficient documentation

## 2017-02-12 DIAGNOSIS — S61217A Laceration without foreign body of left little finger without damage to nail, initial encounter: Secondary | ICD-10-CM | POA: Insufficient documentation

## 2017-02-12 DIAGNOSIS — Y929 Unspecified place or not applicable: Secondary | ICD-10-CM | POA: Insufficient documentation

## 2017-02-12 DIAGNOSIS — I10 Essential (primary) hypertension: Secondary | ICD-10-CM | POA: Insufficient documentation

## 2017-02-12 DIAGNOSIS — S6992XA Unspecified injury of left wrist, hand and finger(s), initial encounter: Secondary | ICD-10-CM | POA: Diagnosis present

## 2017-02-12 DIAGNOSIS — Y939 Activity, unspecified: Secondary | ICD-10-CM | POA: Insufficient documentation

## 2017-02-12 DIAGNOSIS — S61215A Laceration without foreign body of left ring finger without damage to nail, initial encounter: Secondary | ICD-10-CM

## 2017-02-12 MED ORDER — LIDOCAINE HCL (PF) 1 % IJ SOLN
INTRAMUSCULAR | Status: AC
  Administered 2017-02-12: 5 mL
  Filled 2017-02-12: qty 5

## 2017-02-12 MED ORDER — TETANUS-DIPHTH-ACELL PERTUSSIS 5-2.5-18.5 LF-MCG/0.5 IM SUSP
0.5000 mL | Freq: Once | INTRAMUSCULAR | Status: AC
  Administered 2017-02-12: 0.5 mL via INTRAMUSCULAR
  Filled 2017-02-12: qty 0.5

## 2017-02-12 MED ORDER — CEPHALEXIN 500 MG PO CAPS: 500.0000 mg | ORAL_CAPSULE | Freq: Two times a day (BID) | ORAL | 0 refills | Status: AC

## 2017-02-12 MED ORDER — LIDOCAINE HCL 1 % IJ SOLN
5.0000 mL | Freq: Once | INTRAMUSCULAR | Status: AC
  Administered 2017-02-12: 5 mL
  Filled 2017-02-12: qty 5

## 2017-02-12 NOTE — ED Provider Notes (Signed)
[facility] Emergency Department Provider Note  ____________________________________________  Time seen: Approximately 7:18 PM  I have reviewed the triage vital signs and the nursing notes.   HISTORY  Chief Complaint Laceration    HPI Jeffrey Baker is a 83 y.o. male presents to the emergency department with a 2 cm laceration at the distal tip of the left fourth digit that patient sustained with a nail and hammer.  He denies weakness, radiculopathy or changes in sensation of the upper extremities.  Patient cannot recall his last tetanus shot.  No alleviating measures were attempted prior to presenting to the emergency department.   Past Medical History:  Diagnosis Date  . Hypertension     There are no active problems to display for this patient.   Past Surgical History:  Procedure Laterality Date  . BACK SURGERY    . CATARACT EXTRACTION W/ INTRAOCULAR LENS IMPLANT Bilateral   . LUMBAR LAMINECTOMY/DECOMPRESSION MICRODISCECTOMY Left 08/18/2016   Procedure: LUMBAR LAMINECTOMY/DECOMPRESSION MICRODISCECTOMY 2 LEVELS L4-S1;  Surgeon: Meade Maw, MD;  Location: [facility] ORS;  Service: Neurosurgery;  Laterality: Left;  . TONSILLECTOMY      Prior to Admission medications   Medication Sig Start Date End Date Taking? Authorizing Provider  acetaminophen (TYLENOL) 500 MG tablet Take 500 mg by mouth every 8 (eight) hours as needed for mild pain.    [provider]  amLODipine (NORVASC) 10 MG tablet Take 10 mg by mouth daily.    [provider]  aspirin EC 81 MG tablet Take 81 mg by mouth daily.    [provider]  augmented betamethasone dipropionate (DIPROLENE-AF) 0.05 % cream Apply 1 application topically daily. 05/31/16   [provider]  cephALEXin (KEFLEX) 500 MG capsule Take 1 capsule (500 mg total) by mouth 2 (two) times daily. 02/12/17 02/22/17  Lannie Fields, PA-C  etodolac (LODINE) 400 MG tablet Take 400 mg by mouth  2 (two) times daily as needed for mild pain.    [provider]  gabapentin (NEURONTIN) 300 MG capsule Take 300 mg by mouth at bedtime.    [provider]  losartan (COZAAR) 100 MG tablet Take 100 mg by mouth daily.    [provider]  methocarbamol (ROBAXIN) 750 MG tablet Take 1 tablet (750 mg total) by mouth every 6 (six) hours as needed for muscle spasms. 08/18/16   Meade Maw, MD  methylPREDNISolone (MEDROL DOSEPAK) 4 MG TBPK tablet Take as directed on box 08/18/16   Meade Maw, MD  oxyCODONE-acetaminophen (ROXICET) 5-325 MG tablet Take 1 tablet by mouth every 4 (four) hours as needed for severe pain. 08/18/16   Meade Maw, MD  pravastatin (PRAVACHOL) 20 MG tablet Take 20 mg by mouth at bedtime.    [provider]  traMADol (ULTRAM) 50 MG tablet Take 50 mg by mouth daily.    [provider]    Allergies Erythromycin; Indomethacin; and Simvastatin  No family history on file.  Social History Social History  Substance Use Topics  . Smoking status: Never Smoker  . Smokeless tobacco: Never Used  . Alcohol use No     Review of Systems  Constitutional: No fever/chills Eyes: No visual changes. No discharge ENT: No upper respiratory complaints. Cardiovascular: no chest pain. Respiratory: no cough. No SOB. Musculoskeletal: Negative for musculoskeletal pain. Skin: Patient has laceration. Neurological: Negative for headaches, focal weakness or numbness.  ____________________________________________   PHYSICAL EXAM:  VITAL SIGNS: ED Triage Vitals  Enc Vitals Group  BP 02/12/17 1716 (!) 145/67     Pulse Rate 02/12/17 1716 72     Resp 02/12/17 1716 16     Temp 02/12/17 1716 98.4 F (36.9 C)     Temp Source 02/12/17 1716 Oral     SpO2 02/12/17 1716 97 %     Weight 02/12/17 1715 170 lb (77.1 kg)     Height 02/12/17 1715 5\' 8"  (1.727 m)     Head Circumference --      Peak Flow --      Pain Score --      Pain Loc  --      Pain Edu? --      Excl. in [place]? --      Constitutional: Alert and oriented. Well appearing and in no acute distress. Eyes: Conjunctivae are normal. PERRL. EOMI. Head: Atraumatic. Cardiovascular: Normal rate, regular rhythm. Normal S1 and S2.  Good peripheral circulation. Respiratory: Normal respiratory effort without tachypnea or retractions. Lungs CTAB. Good air entry to the bases with no decreased or absent breath sounds. Musculoskeletal: Full range of motion to all extremities. No gross deformities appreciated. Neurologic:  Normal speech and language. No gross focal neurologic deficits are appreciated.  Skin: Patient has 2 cm laceration at the distal tip of the left fourth digit.  Palpable radial pulse, left. Psychiatric: Mood and affect are normal. Speech and behavior are normal. Patient exhibits appropriate insight and judgement.   ____________________________________________   LABS (all labs ordered are listed, but only abnormal results are displayed)  Labs Reviewed - No data to display ____________________________________________  EKG   ____________________________________________  RADIOLOGY   No results found.  ____________________________________________    PROCEDURES  Procedure(s) performed:    Procedures  LACERATION REPAIR Performed by: Lannie Fields Authorized by: Lannie Fields Consent: Verbal consent obtained. Risks and benefits: risks, benefits and alternatives were discussed Consent given by: patient Patient identity confirmed: provided demographic data Prepped and Draped in normal sterile fashion Wound explored  Laceration Location: Left 4th digit  Laceration Length: 2 cm  No Foreign Bodies seen or palpated  Anesthesia: local infiltration  Local anesthetic: lidocaine 1% without epinephrine  Anesthetic total: 2 ml  Irrigation method: syringe Amount of cleaning: standard  Skin closure: 4-0 Ethilon   Number of sutures:  5  Technique: Simple Interrupted   Patient tolerance: Patient tolerated the procedure well with no immediate complications.   Medications  lidocaine (XYLOCAINE) 1 % (with pres) injection 5 mL (not administered)  lidocaine (PF) (XYLOCAINE) 1 % injection (not administered)  Tdap (BOOSTRIX) injection 0.5 mL (not administered)     ____________________________________________   INITIAL IMPRESSION / ASSESSMENT AND PLAN / ED COURSE  Pertinent labs & imaging results that were available during my care of the patient were reviewed by me and considered in my medical decision making (see chart for details).  Review of the [place] CSRS was performed in accordance of the [place] prior to dispensing any controlled drugs.    Assessment and plan Laceration Patient presents to the emergency department with a 2 cm laceration sustained to the tip of the left fourth digit.  Patient underwent laceration repair without complication.  He was advised to have sutures removed by primary care in 9 days.  Patient was discharged with Keflex.  Vital signs were reassuring prior to discharge.  All patient questions were answered.   ____________________________________________  FINAL CLINICAL IMPRESSION(S) / ED DIAGNOSES  Final diagnoses:  Laceration of left ring finger without foreign body without  damage to nail, initial encounter      NEW MEDICATIONS STARTED DURING THIS VISIT:  New Prescriptions   CEPHALEXIN (KEFLEX) 500 MG CAPSULE    Take 1 capsule (500 mg total) by mouth 2 (two) times daily.        This chart was dictated using voice recognition software/Dragon. Despite best efforts to proofread, errors can occur which can change the meaning. Any change was purely unintentional.    Lannie Fields, PA-C 02/12/17 1921    Schuyler Amor, MD 02/12/17 [phone]

## 2017-02-12 NOTE — ED Triage Notes (Signed)
Pt was pulling nail out of board, now has laceration to left hand, 4th digit. Wrapped and bleeding controlled at this time

## 2017-02-12 NOTE — ED Notes (Signed)
Pt reports trying to pull a nail from wall and caught nail between finger and hammer pt does not remember last time he got Tdap

## 2018-04-08 IMAGING — MR MR LUMBAR SPINE W/O CM
5 series · 36 of 48 positions shown · non-contrast
Comparison: Lumbar spine MRI 11/25/2009

CLINICAL DATA: Low back pain for 5 years, radiating into both legs.

EXAM:
MRI LUMBAR SPINE WITHOUT CONTRAST
TECHNIQUE: Multiplanar, multisequence MR imaging of the lumbar spine was
performed. No intravenous contrast was administered.

[Series 2: T2 · sagittal · 4.0mm · 0.81mm/px · 6 of 15 slices shown (1 of 2)]
[im 1/15]
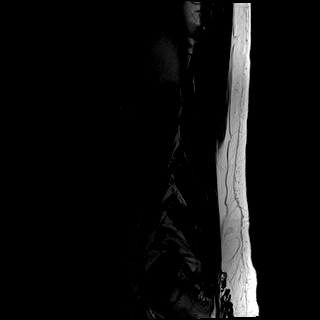
[im 3/15]
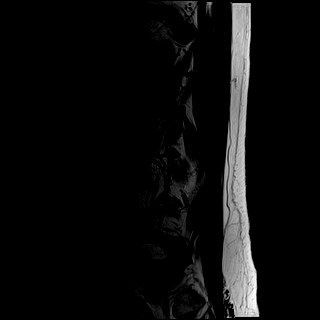
[im 6/15]
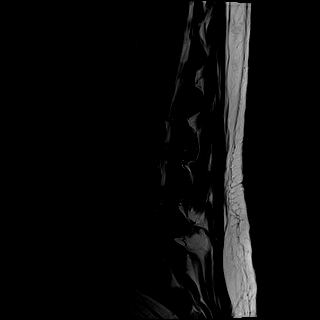
[im 9/15]
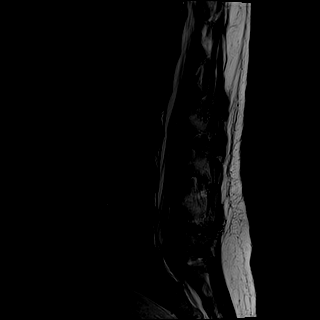
[im 12/15]
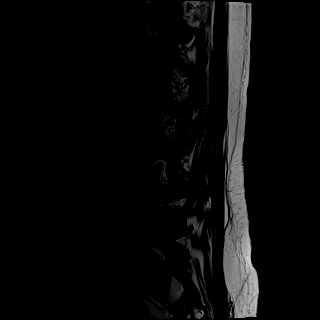
[im 15/15]
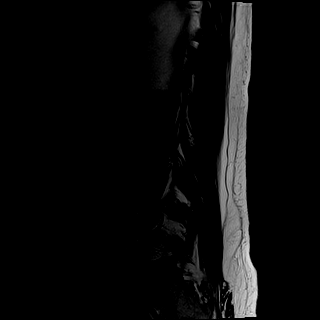

[Series 3: T1 · sagittal · 4.0mm · 0.81mm/px · 6 of 15 slices shown (1 of 2)]
[im 1/15]
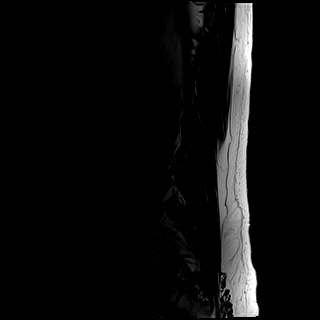
[im 3/15]
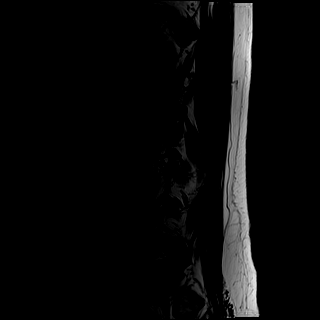
[im 6/15]
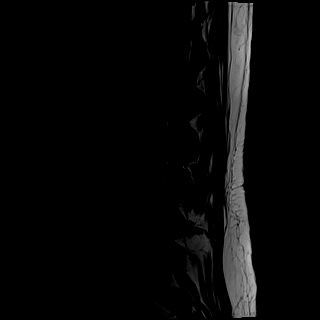
[im 9/15]
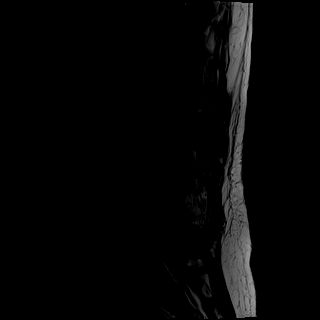
[im 12/15]
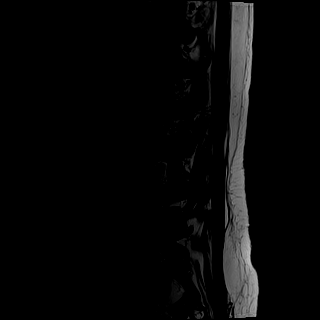
[im 15/15]
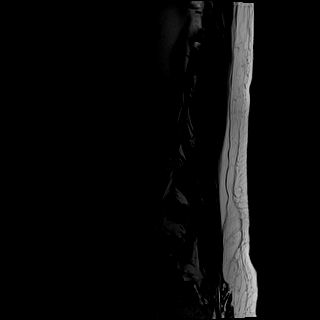

[Series 4: STIR · sagittal · 4.0mm · 1.02mm/px · 6 of 15 slices shown]
[im 1/15]
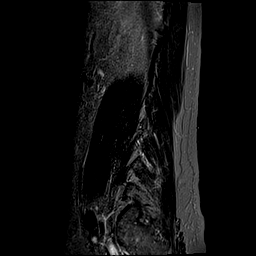
[im 3/15]
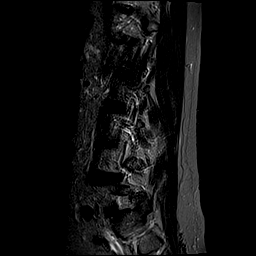
[im 6/15]
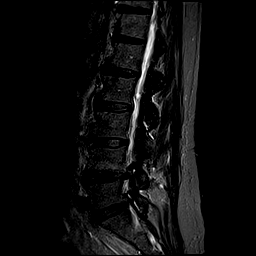
[im 9/15]
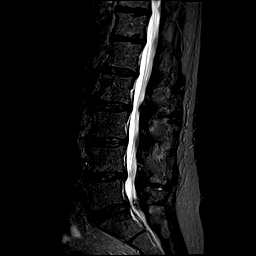
[im 12/15]
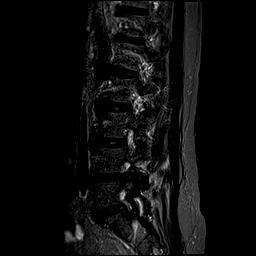
[im 15/15]
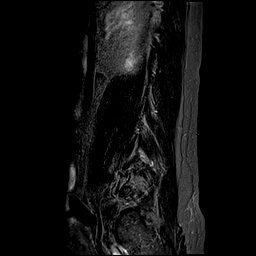

[Series 5: T2 · axial · 4.0mm · 0.78mm/px · z∈[-100,+119]mm · 9 of 38 slices shown (2 of 2)]
[im 1/38]
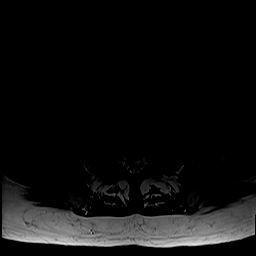
[im 6/38]
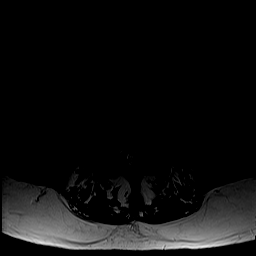
[im 11/38]
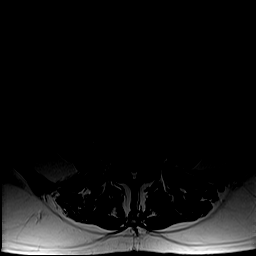
[im 16/38]
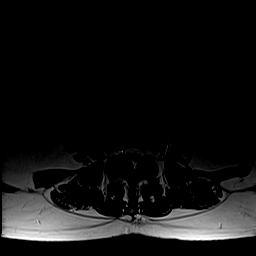
[im 19/38]
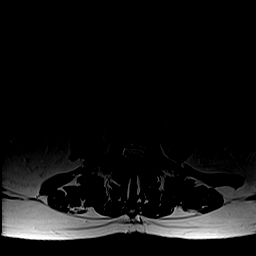
[im 22/38]
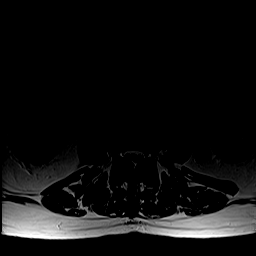
[im 27/38]
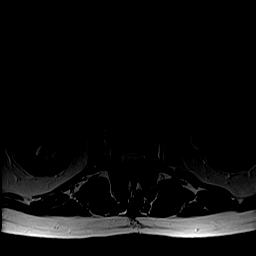
[im 32/38]
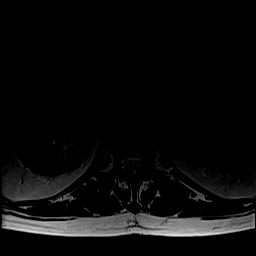
[im 38/38]
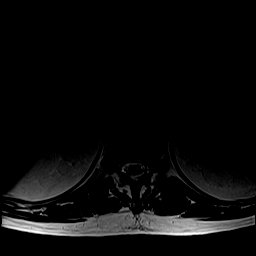

[Series 7: T1 · axial · 4.0mm · 0.39mm/px · z∈[-100,+119]mm · 9 of 38 slices shown (2 of 2)]
[im 1/38]
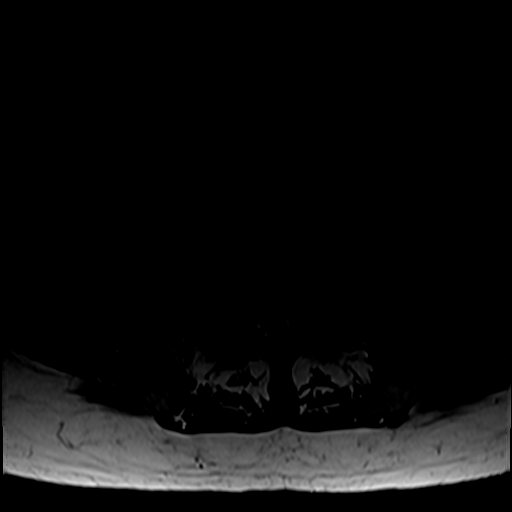
[im 6/38]
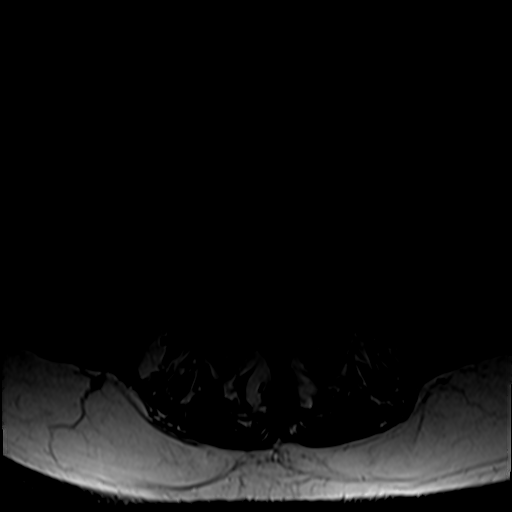
[im 11/38]
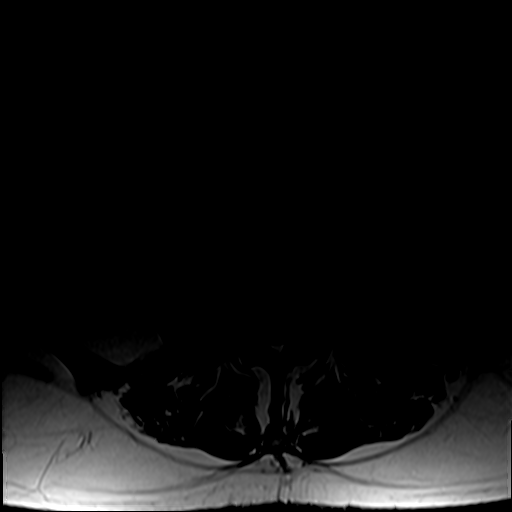
[im 16/38]
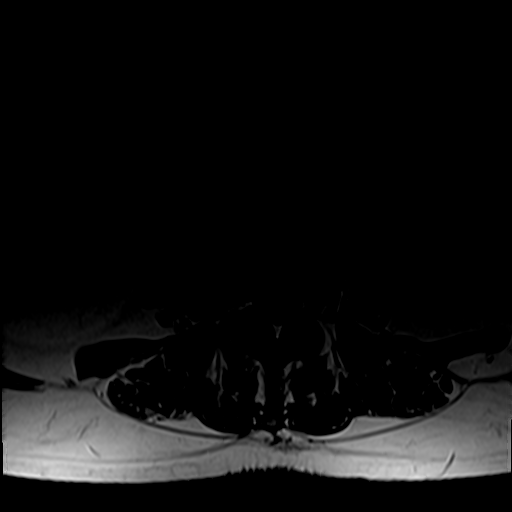
[im 19/38]
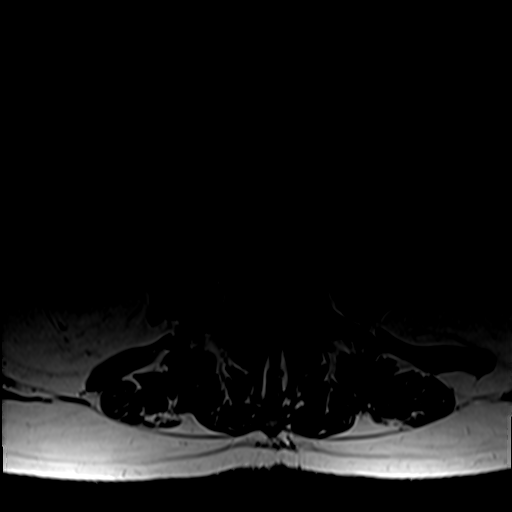
[im 22/38]
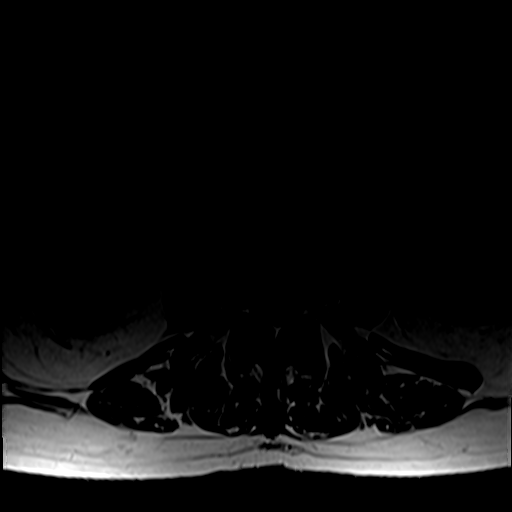
[im 27/38]
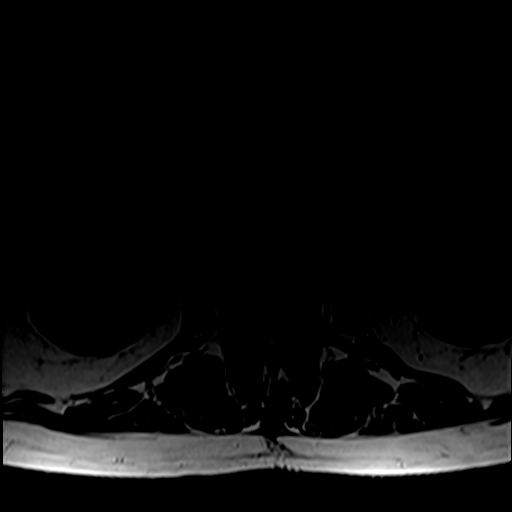
[im 32/38]
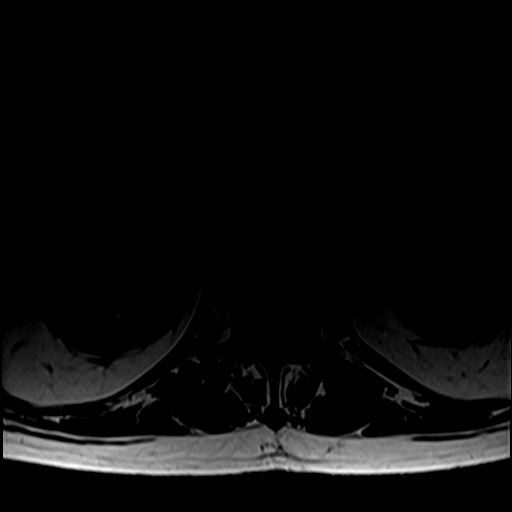
[im 38/38]
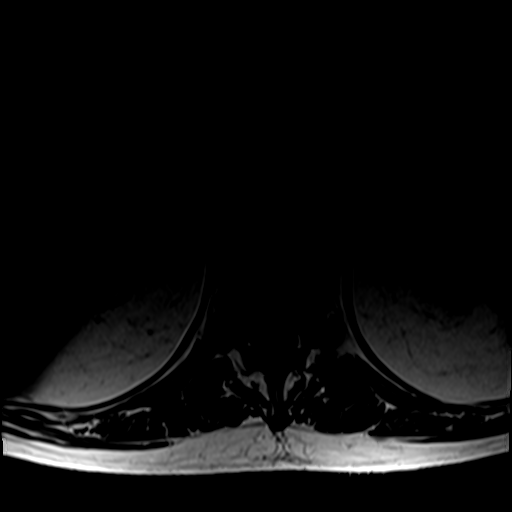

[36 of 48 positions shown; findings below may reference images not displayed]

FINDINGS: Segmentation:  Normal

Alignment:  Grade 1 L4-L5 retrolisthesis, unchanged.

Vertebrae: Schmorl's node at the inferior endplate of L4. Small
hemangioma in the L3 vertebral body. No acute compression fracture
or discitis osteomyelitis. No epidural collection.

Conus medullaris: Extends to the L1 level and appears normal.

Paraspinal and other soft tissues: Unchanged appearance of multiple
bilateral renal sinus cysts measuring up to 2.8 cm.

Disc levels:

T12-L1: Normal disc space and facets. No spinal canal or
neuroforaminal stenosis.

L1-L2: Normal disc space and facets. No spinal canal or
neuroforaminal stenosis.

L2-L3: Disc desiccation with minimal bulge.  No stenosis.

L3-L4: Small bilateral foraminal annular fissures without
significant herniation. No spinal canal or neural foraminal
stenosis.

L4-L5: Medium-sized diffuse disc bulge with focal left foraminal
component that narrows the left lateral recess. Mild right and
moderate left neural foraminal narrowing. No central spinal canal
stenosis. This is slightly worse than on the prior study.

L5-S1: Small disc bulge with left extraforaminal annular fissure.
Severe bilateral facet hypertrophy. Mild central spinal canal
narrowing. Moderate left foraminal stenosis.

Visualized sacrum: Normal.
IMPRESSION: 1. Worsening of left lateral recess stenosis and left neural
foraminal stenosis at L4-L5, secondary to focal left foraminal
component of medium-sized diffuse disc bulge.
2. Mild L5-S1 spinal canal stenosis and moderate left foraminal
narrowing trauma predominantly caused by severe facet arthrosis, but
unchanged from the prior study.
3. New Schmorl's node and degenerative endplate signal changes at
the inferior endplate of L4.

## 2019-06-29 DIAGNOSIS — X32XXXA Exposure to sunlight, initial encounter: Secondary | ICD-10-CM | POA: Diagnosis not present

## 2019-06-29 DIAGNOSIS — D485 Neoplasm of uncertain behavior of skin: Secondary | ICD-10-CM | POA: Diagnosis not present

## 2019-06-29 DIAGNOSIS — D2262 Melanocytic nevi of left upper limb, including shoulder: Secondary | ICD-10-CM | POA: Diagnosis not present

## 2019-06-29 DIAGNOSIS — L570 Actinic keratosis: Secondary | ICD-10-CM | POA: Diagnosis not present

## 2019-06-29 DIAGNOSIS — C44619 Basal cell carcinoma of skin of left upper limb, including shoulder: Secondary | ICD-10-CM | POA: Diagnosis not present

## 2019-06-29 DIAGNOSIS — D2272 Melanocytic nevi of left lower limb, including hip: Secondary | ICD-10-CM | POA: Diagnosis not present

## 2019-06-29 DIAGNOSIS — D2261 Melanocytic nevi of right upper limb, including shoulder: Secondary | ICD-10-CM | POA: Diagnosis not present

## 2019-06-29 DIAGNOSIS — Z85820 Personal history of malignant melanoma of skin: Secondary | ICD-10-CM | POA: Diagnosis not present

## 2019-06-29 DIAGNOSIS — L821 Other seborrheic keratosis: Secondary | ICD-10-CM | POA: Diagnosis not present

## 2019-06-30 DIAGNOSIS — S61411A Laceration without foreign body of right hand, initial encounter: Secondary | ICD-10-CM | POA: Diagnosis not present

## 2019-08-06 DIAGNOSIS — H40003 Preglaucoma, unspecified, bilateral: Secondary | ICD-10-CM | POA: Diagnosis not present

## 2019-08-08 DIAGNOSIS — C44619 Basal cell carcinoma of skin of left upper limb, including shoulder: Secondary | ICD-10-CM | POA: Diagnosis not present

## 2019-08-29 DIAGNOSIS — E782 Mixed hyperlipidemia: Secondary | ICD-10-CM | POA: Diagnosis not present

## 2019-08-29 DIAGNOSIS — Z125 Encounter for screening for malignant neoplasm of prostate: Secondary | ICD-10-CM | POA: Diagnosis not present

## 2019-09-05 DIAGNOSIS — I129 Hypertensive chronic kidney disease with stage 1 through stage 4 chronic kidney disease, or unspecified chronic kidney disease: Secondary | ICD-10-CM | POA: Diagnosis not present

## 2019-09-05 DIAGNOSIS — Z0000 Encounter for general adult medical examination without abnormal findings: Secondary | ICD-10-CM | POA: Diagnosis not present

## 2019-09-05 DIAGNOSIS — N183 Chronic kidney disease, stage 3 unspecified: Secondary | ICD-10-CM | POA: Diagnosis not present

## 2019-09-05 DIAGNOSIS — Z79899 Other long term (current) drug therapy: Secondary | ICD-10-CM | POA: Diagnosis not present

## 2019-09-05 DIAGNOSIS — E785 Hyperlipidemia, unspecified: Secondary | ICD-10-CM | POA: Diagnosis not present

## 2019-12-18 DIAGNOSIS — L570 Actinic keratosis: Secondary | ICD-10-CM | POA: Diagnosis not present

## 2019-12-18 DIAGNOSIS — D2271 Melanocytic nevi of right lower limb, including hip: Secondary | ICD-10-CM | POA: Diagnosis not present

## 2019-12-18 DIAGNOSIS — D225 Melanocytic nevi of trunk: Secondary | ICD-10-CM | POA: Diagnosis not present

## 2019-12-18 DIAGNOSIS — Z85828 Personal history of other malignant neoplasm of skin: Secondary | ICD-10-CM | POA: Diagnosis not present

## 2019-12-18 DIAGNOSIS — X32XXXA Exposure to sunlight, initial encounter: Secondary | ICD-10-CM | POA: Diagnosis not present

## 2019-12-18 DIAGNOSIS — D2261 Melanocytic nevi of right upper limb, including shoulder: Secondary | ICD-10-CM | POA: Diagnosis not present

## 2019-12-18 DIAGNOSIS — D2262 Melanocytic nevi of left upper limb, including shoulder: Secondary | ICD-10-CM | POA: Diagnosis not present

## 2020-03-03 DIAGNOSIS — E782 Mixed hyperlipidemia: Secondary | ICD-10-CM | POA: Diagnosis not present

## 2020-03-10 DIAGNOSIS — Z125 Encounter for screening for malignant neoplasm of prostate: Secondary | ICD-10-CM | POA: Diagnosis not present

## 2020-03-10 DIAGNOSIS — E785 Hyperlipidemia, unspecified: Secondary | ICD-10-CM | POA: Diagnosis not present

## 2020-03-10 DIAGNOSIS — N183 Chronic kidney disease, stage 3 unspecified: Secondary | ICD-10-CM | POA: Diagnosis not present

## 2020-03-10 DIAGNOSIS — I129 Hypertensive chronic kidney disease with stage 1 through stage 4 chronic kidney disease, or unspecified chronic kidney disease: Secondary | ICD-10-CM | POA: Diagnosis not present

## 2021-04-22 DIAGNOSIS — Z85820 Personal history of malignant melanoma of skin: Secondary | ICD-10-CM | POA: Diagnosis not present

## 2021-04-22 DIAGNOSIS — Z85828 Personal history of other malignant neoplasm of skin: Secondary | ICD-10-CM | POA: Diagnosis not present

## 2021-04-22 DIAGNOSIS — X32XXXA Exposure to sunlight, initial encounter: Secondary | ICD-10-CM | POA: Diagnosis not present

## 2021-04-22 DIAGNOSIS — D485 Neoplasm of uncertain behavior of skin: Secondary | ICD-10-CM | POA: Diagnosis not present

## 2021-04-22 DIAGNOSIS — C44319 Basal cell carcinoma of skin of other parts of face: Secondary | ICD-10-CM | POA: Diagnosis not present

## 2021-04-22 DIAGNOSIS — L570 Actinic keratosis: Secondary | ICD-10-CM | POA: Diagnosis not present

## 2021-04-22 DIAGNOSIS — D2261 Melanocytic nevi of right upper limb, including shoulder: Secondary | ICD-10-CM | POA: Diagnosis not present

## 2021-04-22 DIAGNOSIS — D225 Melanocytic nevi of trunk: Secondary | ICD-10-CM | POA: Diagnosis not present

## 2021-04-22 DIAGNOSIS — D2271 Melanocytic nevi of right lower limb, including hip: Secondary | ICD-10-CM | POA: Diagnosis not present

## 2021-04-22 DIAGNOSIS — C44311 Basal cell carcinoma of skin of nose: Secondary | ICD-10-CM | POA: Diagnosis not present

## 2021-06-18 DIAGNOSIS — C44319 Basal cell carcinoma of skin of other parts of face: Secondary | ICD-10-CM | POA: Diagnosis not present

## 2021-06-18 DIAGNOSIS — L905 Scar conditions and fibrosis of skin: Secondary | ICD-10-CM | POA: Diagnosis not present

## 2021-07-06 DIAGNOSIS — C4491 Basal cell carcinoma of skin, unspecified: Secondary | ICD-10-CM | POA: Diagnosis not present

## 2021-07-06 DIAGNOSIS — Z481 Encounter for planned postprocedural wound closure: Secondary | ICD-10-CM | POA: Diagnosis not present

## 2021-07-06 DIAGNOSIS — C44311 Basal cell carcinoma of skin of nose: Secondary | ICD-10-CM | POA: Diagnosis not present

## 2021-08-11 DIAGNOSIS — Z961 Presence of intraocular lens: Secondary | ICD-10-CM | POA: Diagnosis not present

## 2021-09-29 DIAGNOSIS — E782 Mixed hyperlipidemia: Secondary | ICD-10-CM | POA: Diagnosis not present

## 2021-09-29 DIAGNOSIS — Z125 Encounter for screening for malignant neoplasm of prostate: Secondary | ICD-10-CM | POA: Diagnosis not present

## 2021-10-06 DIAGNOSIS — Z1389 Encounter for screening for other disorder: Secondary | ICD-10-CM | POA: Diagnosis not present

## 2021-10-06 DIAGNOSIS — E785 Hyperlipidemia, unspecified: Secondary | ICD-10-CM | POA: Diagnosis not present

## 2021-10-06 DIAGNOSIS — N183 Chronic kidney disease, stage 3 unspecified: Secondary | ICD-10-CM | POA: Diagnosis not present

## 2021-10-06 DIAGNOSIS — C439 Malignant melanoma of skin, unspecified: Secondary | ICD-10-CM | POA: Diagnosis not present

## 2021-10-06 DIAGNOSIS — Z0000 Encounter for general adult medical examination without abnormal findings: Secondary | ICD-10-CM | POA: Diagnosis not present

## 2022-02-22 DIAGNOSIS — D2262 Melanocytic nevi of left upper limb, including shoulder: Secondary | ICD-10-CM | POA: Diagnosis not present

## 2022-02-22 DIAGNOSIS — X32XXXA Exposure to sunlight, initial encounter: Secondary | ICD-10-CM | POA: Diagnosis not present

## 2022-02-22 DIAGNOSIS — L570 Actinic keratosis: Secondary | ICD-10-CM | POA: Diagnosis not present

## 2022-02-22 DIAGNOSIS — D225 Melanocytic nevi of trunk: Secondary | ICD-10-CM | POA: Diagnosis not present

## 2022-02-22 DIAGNOSIS — D2261 Melanocytic nevi of right upper limb, including shoulder: Secondary | ICD-10-CM | POA: Diagnosis not present

## 2022-02-22 DIAGNOSIS — Z85828 Personal history of other malignant neoplasm of skin: Secondary | ICD-10-CM | POA: Diagnosis not present

## 2022-03-31 DIAGNOSIS — E782 Mixed hyperlipidemia: Secondary | ICD-10-CM | POA: Diagnosis not present

## 2022-04-07 DIAGNOSIS — E785 Hyperlipidemia, unspecified: Secondary | ICD-10-CM | POA: Diagnosis not present

## 2022-04-07 DIAGNOSIS — Z125 Encounter for screening for malignant neoplasm of prostate: Secondary | ICD-10-CM | POA: Diagnosis not present

## 2022-04-07 DIAGNOSIS — I10 Essential (primary) hypertension: Secondary | ICD-10-CM | POA: Diagnosis not present

## 2022-08-24 DIAGNOSIS — Z961 Presence of intraocular lens: Secondary | ICD-10-CM | POA: Diagnosis not present

## 2022-08-30 DIAGNOSIS — Z08 Encounter for follow-up examination after completed treatment for malignant neoplasm: Secondary | ICD-10-CM | POA: Diagnosis not present

## 2022-08-30 DIAGNOSIS — D2262 Melanocytic nevi of left upper limb, including shoulder: Secondary | ICD-10-CM | POA: Diagnosis not present

## 2022-08-30 DIAGNOSIS — D2271 Melanocytic nevi of right lower limb, including hip: Secondary | ICD-10-CM | POA: Diagnosis not present

## 2022-08-30 DIAGNOSIS — Z85828 Personal history of other malignant neoplasm of skin: Secondary | ICD-10-CM | POA: Diagnosis not present

## 2022-08-30 DIAGNOSIS — D2261 Melanocytic nevi of right upper limb, including shoulder: Secondary | ICD-10-CM | POA: Diagnosis not present

## 2022-08-30 DIAGNOSIS — D2272 Melanocytic nevi of left lower limb, including hip: Secondary | ICD-10-CM | POA: Diagnosis not present

## 2022-08-30 DIAGNOSIS — D225 Melanocytic nevi of trunk: Secondary | ICD-10-CM | POA: Diagnosis not present

## 2022-08-30 DIAGNOSIS — L570 Actinic keratosis: Secondary | ICD-10-CM | POA: Diagnosis not present

## 2022-08-30 DIAGNOSIS — L814 Other melanin hyperpigmentation: Secondary | ICD-10-CM | POA: Diagnosis not present

## 2022-10-05 DIAGNOSIS — Z125 Encounter for screening for malignant neoplasm of prostate: Secondary | ICD-10-CM | POA: Diagnosis not present

## 2022-10-05 DIAGNOSIS — E782 Mixed hyperlipidemia: Secondary | ICD-10-CM | POA: Diagnosis not present

## 2022-10-12 DIAGNOSIS — E785 Hyperlipidemia, unspecified: Secondary | ICD-10-CM | POA: Diagnosis not present

## 2022-10-12 DIAGNOSIS — Z0000 Encounter for general adult medical examination without abnormal findings: Secondary | ICD-10-CM | POA: Diagnosis not present

## 2022-10-12 DIAGNOSIS — Z1331 Encounter for screening for depression: Secondary | ICD-10-CM | POA: Diagnosis not present

## 2022-10-12 DIAGNOSIS — I129 Hypertensive chronic kidney disease with stage 1 through stage 4 chronic kidney disease, or unspecified chronic kidney disease: Secondary | ICD-10-CM | POA: Diagnosis not present

## 2022-10-12 DIAGNOSIS — Z85820 Personal history of malignant melanoma of skin: Secondary | ICD-10-CM | POA: Diagnosis not present

## 2022-10-12 DIAGNOSIS — Z125 Encounter for screening for malignant neoplasm of prostate: Secondary | ICD-10-CM | POA: Diagnosis not present

## 2022-10-12 DIAGNOSIS — N183 Chronic kidney disease, stage 3 unspecified: Secondary | ICD-10-CM | POA: Diagnosis not present

## 2022-12-20 NOTE — Progress Notes (Signed)
Referring Physician:  No referring provider defined for this encounter.  Primary Physician:  Danella Penton, MD  History of Present Illness: 12/27/2022 [name] has a history of skin CA, hyperlipidemia, and HTN.   He had lumbar decompression L4-S1 by Dr. Myer Haff on 08/18/16.   He has constant left sided LBP that radiates to left posterior leg to his knee and sometimes just below the knee. Pain is worse with driving and sitting. He has some relief with walking. No right leg pain. No numbness, tingling in left leg. He has cramping and weakness in left leg.   Bowel/Bladder Dysfunction: none  He does not smoke.    Conservative measures:  Physical therapy: has not participated in  Multimodal medical therapy including regular antiinflammatories: tylenol  Injections: has not received epidural steroid injections  Past Surgery:  lumbar decompression L4-S1 by Dr. Myer Haff on 08/18/16  Cheron Every has no symptoms of cervical myelopathy.  The symptoms are causing a significant impact on the patient's life.   Review of Systems:  A 10 point review of systems is negative, except for the pertinent positives and negatives detailed in the HPI.  Past Medical History: Past Medical History:  Diagnosis Date   Hypertension     Past Surgical History: Past Surgical History:  Procedure Laterality Date   BACK SURGERY     CATARACT EXTRACTION W/ INTRAOCULAR LENS IMPLANT Bilateral    LUMBAR LAMINECTOMY/DECOMPRESSION MICRODISCECTOMY Left 08/18/2016   Procedure: LUMBAR LAMINECTOMY/DECOMPRESSION MICRODISCECTOMY 2 LEVELS L4-S1;  Surgeon: Venetia Night, MD;  Location: [facility] ORS;  Service: Neurosurgery;  Laterality: Left;   TONSILLECTOMY      Allergies: Allergies as of 12/27/2022 - Review Complete 12/27/2022  Allergen Reaction Noted   Erythromycin Other (See Comments) 06/14/2014   Indomethacin Other (See Comments) 06/14/2014   Simvastatin Other (See Comments) 06/14/2014     Medications: Outpatient Encounter Medications as of 12/27/2022  Medication Sig   acetaminophen (TYLENOL) 500 MG tablet Take 500 mg by mouth every 8 (eight) hours as needed for mild pain.   amLODipine (NORVASC) 10 MG tablet Take 10 mg by mouth daily.   aspirin EC 81 MG tablet Take 81 mg by mouth daily.   losartan (COZAAR) 50 MG tablet Take 50 mg by mouth daily.   pravastatin (PRAVACHOL) 20 MG tablet Take 20 mg by mouth at bedtime.   [DISCONTINUED] augmented betamethasone dipropionate (DIPROLENE-AF) 0.05 % cream Apply 1 application topically daily.   [DISCONTINUED] etodolac (LODINE) 400 MG tablet Take 400 mg by mouth 2 (two) times daily as needed for mild pain.   [DISCONTINUED] gabapentin (NEURONTIN) 300 MG capsule Take 300 mg by mouth at bedtime.   [DISCONTINUED] methocarbamol (ROBAXIN) 750 MG tablet Take 1 tablet (750 mg total) by mouth every 6 (six) hours as needed for muscle spasms.   [DISCONTINUED] traMADol (ULTRAM) 50 MG tablet Take 50 mg by mouth daily.   [DISCONTINUED] methylPREDNISolone (MEDROL DOSEPAK) 4 MG TBPK tablet Take as directed on box (Patient not taking: Reported on 12/27/2022)   [DISCONTINUED] oxyCODONE-acetaminophen (ROXICET) 5-325 MG tablet Take 1 tablet by mouth every 4 (four) hours as needed for severe pain. (Patient not taking: Reported on 12/27/2022)   No facility-administered encounter medications on file as of 12/27/2022.    Social History: Social History   Tobacco Use   Smoking status: Never   Smokeless tobacco: Never  Substance Use Topics   Alcohol use: No   Drug use: No    Family Medical History: History reviewed. No pertinent  family history.  Physical Examination: Vitals:   12/27/22 0910  BP: 136/76    General: Patient is well developed, well nourished, calm, collected, and in no apparent distress. Attention to examination is appropriate.  Respiratory: Patient is breathing without any difficulty.   NEUROLOGICAL:     Awake, alert, oriented  to person, place, and time.  Speech is clear and fluent. Fund of knowledge is appropriate.   Cranial Nerves: Pupils equal round and reactive to light.  Facial tone is symmetric.    Well healed lumbar incision.   Minimal central lower posterior lumbar tenderness.   No abnormal lesions on exposed skin.   Strength: Side Biceps Triceps Deltoid Interossei Grip Wrist Ext. Wrist Flex.  R 5 5 5 5 5 5 5   L 5 5 5 5 5 5 5    Side Iliopsoas Quads Hamstring PF DF EHL  R 5 5 5 5 5 5   L 5 5 5 5 5 5    Reflexes are 2+ and symmetric at the biceps, brachioradialis, patella and achilles.   Hoffman's is absent.  Clonus is not present.   Bilateral upper and lower extremity sensation is intact to light touch.     Gait is normal.     Medical Decision Making  Imaging: No recent lumbar imaging.   Assessment and Plan: Mr. Rebelo is a pleasant 89 y.o. male who had lumbar decompression L4-S1 by Dr. Myer Haff on 08/18/16. He did great after surgery until last few months.   He has constant left sided LBP that radiates to left posterior leg to his knee and sometimes just below the knee. Pain is worse with driving and sitting. He has some relief with walking. No right leg pain.  No lumbar imaging. Back and left leg pain appear lumbar mediated.   Treatment options discussed with patient and following plan made:   - MRI of lumbar spine to evaluate lumbar radiculopathy s/p surgery.  - He can continue on OTC tylenol as directed for pain.  - Depending on MRI results, may consider PT and/or injections.  - Will schedule follow up visit to review MRI results once I get them back.   I spent a total of 25 minutes in face-to-face and non-face-to-face activities related to this patient's care today including review of outside records, review of imaging, review of symptoms, physical exam, discussion of differential diagnosis, discussion of treatment options, and documentation.   Thank you for involving me in the care  of this patient.   Drake Leach PA-C Dept. of Neurosurgery

## 2022-12-27 ENCOUNTER — Encounter: Payer: Self-pay | Admitting: Orthopedic Surgery

## 2022-12-27 ENCOUNTER — Ambulatory Visit: Payer: Medicare HMO | Admitting: Orthopedic Surgery

## 2022-12-27 VITALS — BP 136/76 | Ht 66.25 in | Wt 168.4 lb

## 2022-12-27 DIAGNOSIS — M5416 Radiculopathy, lumbar region: Secondary | ICD-10-CM

## 2022-12-27 DIAGNOSIS — Z9889 Other specified postprocedural states: Secondary | ICD-10-CM | POA: Diagnosis not present

## 2022-12-27 DIAGNOSIS — M545 Low back pain, unspecified: Secondary | ICD-10-CM | POA: Diagnosis not present

## 2022-12-27 DIAGNOSIS — M5442 Lumbago with sciatica, left side: Secondary | ICD-10-CM

## 2022-12-27 NOTE — Patient Instructions (Signed)
It was so nice to see you today. Thank you so much for coming in.    I think the pain in your back and left leg is coming from your back.   I want to get an MRI of your lower back to look into things further. We will get this approved through your insurance and [facility] will call you to schedule the appointment.   [facility] (building with the white pillars) is located off of [address]. The address is [address] [place], [place] [address].   After you have the MRI, it takes 10-14 days for me to get the results back. Once I have them, we will call you to schedule a follow up visit with me to review them.   You can continue on over the counter tylenol as directed on bottle for pain. Take with food.   Please do not hesitate to call if you have any questions or concerns. You can also message me in MyChart.   Drake Leach PA-C [phone]

## 2023-01-04 ENCOUNTER — Ambulatory Visit
Admission: RE | Admit: 2023-01-04 | Discharge: 2023-01-04 | Disposition: A | Discharge status: Home / Self Care | Payer: Medicare HMO | Source: Ambulatory Visit | Attending: Orthopedic Surgery | Admitting: Orthopedic Surgery

## 2023-01-04 DIAGNOSIS — M5442 Lumbago with sciatica, left side: Secondary | ICD-10-CM | POA: Diagnosis not present

## 2023-01-04 DIAGNOSIS — M47816 Spondylosis without myelopathy or radiculopathy, lumbar region: Secondary | ICD-10-CM | POA: Diagnosis not present

## 2023-01-04 DIAGNOSIS — M5126 Other intervertebral disc displacement, lumbar region: Secondary | ICD-10-CM | POA: Diagnosis not present

## 2023-01-04 DIAGNOSIS — Z9889 Other specified postprocedural states: Secondary | ICD-10-CM | POA: Insufficient documentation

## 2023-01-04 DIAGNOSIS — M5416 Radiculopathy, lumbar region: Secondary | ICD-10-CM | POA: Diagnosis not present

## 2023-01-04 DIAGNOSIS — M4807 Spinal stenosis, lumbosacral region: Secondary | ICD-10-CM | POA: Diagnosis not present

## 2023-01-04 DIAGNOSIS — M48061 Spinal stenosis, lumbar region without neurogenic claudication: Secondary | ICD-10-CM | POA: Diagnosis not present

## 2023-02-04 NOTE — Progress Notes (Unsigned)
Referring Physician:  Danella Penton, MD [address] [facility] [address] [facility] [place],  [place] [address]  Primary Physician:  Danella Penton, MD  History of Present Illness: 02/04/2023 [name] has a history of skin CA, hyperlipidemia, and HTN.   He had lumbar decompression L4-S1 by Dr. Myer Haff on 08/18/16.   Last seen by me on 12/27/22 with constant left sided LBP and left leg pain. He was to continue on prn OTC tylenol.   He is here to review his lumbar MRI.   He continues with constant left sided LBP that radiates to left posterior leg to his knee and sometimes just below the knee. Pain is worse with bending, driving, and walking. No right leg pain. No numbness, tingling in left leg. He has cramping and weakness in left leg.   Bowel/Bladder Dysfunction: none  He does not smoke.    Conservative measures:  Physical therapy: has not participated in  Multimodal medical therapy including regular antiinflammatories: tylenol  Injections: has not received epidural steroid injections  Past Surgery:  lumbar decompression L4-S1 by Dr. Myer Haff on 08/18/16  Jeffrey Baker has no symptoms of cervical myelopathy.  The symptoms are causing a significant impact on the patient's life.   Review of Systems:  A 10 point review of systems is negative, except for the pertinent positives and negatives detailed in the HPI.  Past Medical History: Past Medical History:  Diagnosis Date   Hypertension     Past Surgical History: Past Surgical History:  Procedure Laterality Date   BACK SURGERY     CATARACT EXTRACTION W/ INTRAOCULAR LENS IMPLANT Bilateral    LUMBAR LAMINECTOMY/DECOMPRESSION MICRODISCECTOMY Left 08/18/2016   Procedure: LUMBAR LAMINECTOMY/DECOMPRESSION MICRODISCECTOMY 2 LEVELS L4-S1;  Surgeon: Venetia Night, MD;  Location: [facility] ORS;  Service: Neurosurgery;  Laterality: Left;   TONSILLECTOMY      Allergies: Allergies as of 02/07/2023 - Review  Complete 12/27/2022  Allergen Reaction Noted   Erythromycin Other (See Comments) 06/14/2014   Indomethacin Other (See Comments) 06/14/2014   Simvastatin Other (See Comments) 06/14/2014    Medications: Outpatient Encounter Medications as of 02/07/2023  Medication Sig   acetaminophen (TYLENOL) 500 MG tablet Take 500 mg by mouth Baker 8 (eight) hours as needed for mild pain.   amLODipine (NORVASC) 10 MG tablet Take 10 mg by mouth daily.   aspirin EC 81 MG tablet Take 81 mg by mouth daily.   losartan (COZAAR) 50 MG tablet Take 50 mg by mouth daily.   pravastatin (PRAVACHOL) 20 MG tablet Take 20 mg by mouth at bedtime.   No facility-administered encounter medications on file as of 02/07/2023.    Social History: Social History   Tobacco Use   Smoking status: Never   Smokeless tobacco: Never  Substance Use Topics   Alcohol use: No   Drug use: No    Family Medical History: No family history on file.  Physical Examination: There were no vitals filed for this visit.    Awake, alert, oriented to person, place, and time.  Speech is clear and fluent. Fund of knowledge is appropriate.   Cranial Nerves: Pupils equal round and reactive to light.  Facial tone is symmetric.    Minimal central lower posterior lumbar tenderness.   No abnormal lesions on exposed skin.   Strength:  Side Iliopsoas Quads Hamstring PF DF EHL  R 5 5 5 5 5 5   L 5 5 5 5 5 5    Reflexes are 2+ and  symmetric at the patella and achilles.    Clonus is not present.   Bilateral lower extremity sensation is intact to light touch.     Gait is normal.     Medical Decision Making  Imaging: Lumbar MRI dated 01/04/23:  FINDINGS: Segmentation:  Standard.   Alignment: 0.3 cm retrolisthesis L4 on L5 is unchanged. Alignment is otherwise normal.   Vertebrae:  No fracture, evidence of discitis, or bone lesion.   Conus medullaris and cauda equina: Conus extends to the L1 level. Conus and cauda equina appear  normal.   Paraspinal and other soft tissues: Bilateral renal cysts are unchanged.   Disc levels:   T11-12 is imaged in the sagittal plane only. There is a shallow disc bulge but no stenosis is identified.   T12-L1: Negative.   L1-2: Mild-to-moderate facet arthropathy with some ligamentum flavum thickening. Minimal disc bulge. No stenosis.   L2-3: Moderate bilateral facet degenerative change and a shallow disc bulge without stenosis.   L3-4: Moderate moderately severe bilateral facet degenerative disease and a shallow disc bulge. No stenosis.   L4-5: The patient is status post left laminotomy for discectomy. There is a disc bulge, moderate facet arthropathy and ligamentum flavum thickening. Moderate central canal stenosis is present and there is mild to moderate bilateral foraminal narrowing.   L5-S1: Advanced bilateral facet degenerative disease is seen. A synovial cyst measuring 0.7 cm AP by 0.4 cm transverse by 1 cm craniocaudal is seen off the anteromedial portion of the left facet joint. The cyst deforms the left aspect of the thecal sac and impinges the descending left S1 root. There is moderately severe central canal stenosis at L5-S1 due to a disc bulge. Moderate left foraminal narrowing is also present. The right foramen is open.   IMPRESSION: 1. Synovial cyst off the anteromedial portion of the left L5-S1 facet joint deforms the left aspect of the thecal sac and impinges the descending left S1 root. There is also moderately severe central canal stenosis and moderate left foraminal narrowing at L5-S1. 2. Status post left laminotomy for discectomy at L4-5. Moderate to moderately severe central canal stenosis and mild to moderate bilateral foraminal narrowing at L4-5.     Electronically Signed   By: Drusilla Kanner M.D.   On: 01/25/2023 08:14  Assessment and Plan: [name] is a pleasant 89 y.o. male who had lumbar decompression L4-S1 by Dr. Myer Haff on  08/18/16. He did great after surgery until last few months.   He continues with constant left sided LBP that radiates to left posterior leg to his knee and sometimes just below the knee. Pain is worse with bending, driving, and walking. No right leg pain.   He has known lumbar spondylosis with slip L4-L5. Left left symptoms are likely from left sided synovial cyst that impinges on left S1 nerve root. LBP is likely from underlying spondylosis.   Treatment options discussed with patient and following plan made:   - PT for lumbar spine. Orders to [facility].  - Discussed lumbar injections. He declines.  - He can continue on OTC tylenol as directed for pain. Discussed trial of neurontin at night. He declines for now.  - Follow up with me in 6 weeks. Will let me know if he is discharged from PT and then can change f/u to Dr. Myer Haff. He is interested in surgery options.   I spent a total of 20 minutes in face-to-face and non-face-to-face activities related to this patient's care today including review of  outside records, review of imaging, review of symptoms, physical exam, discussion of differential diagnosis, discussion of treatment options, and documentation.   Drake Leach PA-C Dept. of Neurosurgery

## 2023-02-07 ENCOUNTER — Encounter: Payer: Self-pay | Admitting: Orthopedic Surgery

## 2023-02-07 ENCOUNTER — Ambulatory Visit: Payer: Medicare HMO | Admitting: Orthopedic Surgery

## 2023-02-07 VITALS — BP 130/78 | Ht 66.25 in | Wt 168.0 lb

## 2023-02-07 DIAGNOSIS — Z9889 Other specified postprocedural states: Secondary | ICD-10-CM

## 2023-02-07 DIAGNOSIS — M4726 Other spondylosis with radiculopathy, lumbar region: Secondary | ICD-10-CM | POA: Diagnosis not present

## 2023-02-07 DIAGNOSIS — M47816 Spondylosis without myelopathy or radiculopathy, lumbar region: Secondary | ICD-10-CM

## 2023-02-07 DIAGNOSIS — M7138 Other bursal cyst, other site: Secondary | ICD-10-CM | POA: Diagnosis not present

## 2023-02-07 DIAGNOSIS — M5416 Radiculopathy, lumbar region: Secondary | ICD-10-CM

## 2023-02-07 NOTE — Patient Instructions (Signed)
It was so nice to see you today. Thank you so much for coming in.    You have some wear and tear in your back (arthritis) and I think your left leg pain is from a facet cyst on the left at L5-S1.   I sent physical therapy orders to [facility]. You can call them at [phone] if you don't hear from them to schedule your visit. They are located at [employer] in [place].   Let me know if you have been discharged from PT and I will change your follow up to Dr. Myer Haff.   I will see you back in 6 weeks. Please do not hesitate to call if you have any questions or concerns. You can also message me in MyChart.   Drake Leach PA-C [phone]     The physicians and staff at [facility] at [facility] are committed to providing excellent care. You may receive a survey asking for feedback about your experience at our office. We value you your feedback and appreciate you taking the time to to fill it out. The [facility] leadership team is also available to discuss your experience in person, feel free to contact us [phone].

## 2023-02-10 DIAGNOSIS — M545 Low back pain, unspecified: Secondary | ICD-10-CM | POA: Diagnosis not present

## 2023-02-15 DIAGNOSIS — M545 Low back pain, unspecified: Secondary | ICD-10-CM | POA: Diagnosis not present

## 2023-03-01 DIAGNOSIS — M545 Low back pain, unspecified: Secondary | ICD-10-CM | POA: Diagnosis not present

## 2023-03-17 NOTE — Progress Notes (Signed)
Referring Physician:  Danella Penton, MD [address] [facility] [address] [facility] [place],  [place] [address]  Primary Physician:  Jeffrey Penton, MD  History of Present Illness: 03/17/2023 [name] has a history of skin CA, hyperlipidemia, and HTN.   He had lumbar decompression L4-S1 by Dr. Myer Baker on 08/18/16.   Last seen by me on 02/07/23 with constant left sided LBP and left leg pain. He has known lumbar spondylosis with slip L4-L5. Left left symptoms are likely from left sided synovial cyst that impinges on left S1 nerve root. LBP is likely from underlying spondylosis.   He was sent to PT at last visit- he did 3 visits at [facility] and stopped going as his pain was under control.   He is here for follow up.   No back or leg pain. He had relief with PT. He has intermittent soreness in his back with bending and lifting. No numbness, tingling, or weakness in his legs. He has some intermittent cramping in his left leg.   Conservative measures:  Physical therapy: [facility]- initial eval 02/10/23. Did 3 visits and stopped on 03/01/23 as pain was improved. Multimodal medical therapy including regular antiinflammatories: tylenol  Injections: has not received epidural steroid injections  Past Surgery:  lumbar decompression L4-S1 by Dr. Myer Baker on 08/18/16  Jeffrey Baker has no symptoms of cervical myelopathy.  The symptoms are causing a significant impact on the patient's life.   Review of Systems:  A 10 point review of systems is negative, except for the pertinent positives and negatives detailed in the HPI.  Past Medical History: Past Medical History:  Diagnosis Date   Hypertension     Past Surgical History: Past Surgical History:  Procedure Laterality Date   BACK SURGERY     CATARACT EXTRACTION W/ INTRAOCULAR LENS IMPLANT Bilateral    LUMBAR LAMINECTOMY/DECOMPRESSION MICRODISCECTOMY Left 08/18/2016   Procedure: LUMBAR LAMINECTOMY/DECOMPRESSION  MICRODISCECTOMY 2 LEVELS L4-S1;  Surgeon: Jeffrey Night, MD;  Location: [facility] ORS;  Service: Neurosurgery;  Laterality: Left;   TONSILLECTOMY      Allergies: Allergies as of 03/21/2023 - Review Complete 02/07/2023  Allergen Reaction Noted   Erythromycin Other (See Comments) 06/14/2014   Indomethacin Other (See Comments) 06/14/2014   Simvastatin Other (See Comments) 06/14/2014    Medications: Outpatient Encounter Medications as of 03/21/2023  Medication Sig   acetaminophen (TYLENOL) 500 MG tablet Take 500 mg by mouth Baker 8 (eight) hours as needed for mild pain.   amLODipine (NORVASC) 10 MG tablet Take 10 mg by mouth daily.   aspirin EC 81 MG tablet Take 81 mg by mouth daily.   losartan (COZAAR) 50 MG tablet Take 50 mg by mouth daily.   pravastatin (PRAVACHOL) 20 MG tablet Take 20 mg by mouth at bedtime.   No facility-administered encounter medications on file as of 03/21/2023.    Social History: Social History   Tobacco Use   Smoking status: Never   Smokeless tobacco: Never  Substance Use Topics   Alcohol use: No   Drug use: No    Family Medical History: No family history on file.  Physical Examination: There were no vitals filed for this visit.    Awake, alert, oriented to person, place, and time.  Speech is clear and fluent. Fund of knowledge is appropriate.   Cranial Nerves: Pupils equal round and reactive to light.  Facial tone is symmetric.    Strength:  Side Iliopsoas Quads Hamstring PF DF EHL  R  5 5 5 5 5 5   L 5 5 5 5 5 5    Bilateral lower extremity sensation is intact to light touch.     Gait is normal.     Medical Decision Making  Imaging: None  Assessment and Plan: [name] is a pleasant 89 y.o. male who had lumbar decompression L4-S1 by Dr. Myer Baker on 08/18/16.   He has improved with PT. He has no current LBP or left leg pain. He notes some soreness in his back with bending or lifting.   He has known lumbar spondylosis with slip  L4-L5. Left left symptoms are likely from left sided synovial cyst that impinges on left S1 nerve root. LBP is likely from underlying spondylosis.   Treatment options discussed with patient and following plan made:   - Continue HEP from PT.  - Last flare up started after working on his car engine for 2 days. If he needs to do this again, will work shorter periods over more days.  - If he gets worse, can revisit injections (did not want to do this time) and possible surgery options.   I spent a total of 10 minutes in face-to-face and non-face-to-face activities related to this patient's care today including review of outside records, review of imaging, review of symptoms, physical exam, discussion of differential diagnosis, discussion of treatment options, and documentation.   Jeffrey Leach PA-C Dept. of Neurosurgery

## 2023-03-21 ENCOUNTER — Encounter: Payer: Self-pay | Admitting: Orthopedic Surgery

## 2023-03-21 ENCOUNTER — Ambulatory Visit: Payer: Medicare HMO | Admitting: Orthopedic Surgery

## 2023-03-21 VITALS — BP 118/74 | Ht 66.25 in | Wt 168.0 lb

## 2023-03-21 DIAGNOSIS — M47816 Spondylosis without myelopathy or radiculopathy, lumbar region: Secondary | ICD-10-CM | POA: Diagnosis not present

## 2023-03-21 DIAGNOSIS — Z9889 Other specified postprocedural states: Secondary | ICD-10-CM

## 2023-03-21 DIAGNOSIS — M7138 Other bursal cyst, other site: Secondary | ICD-10-CM

## 2024-01-19 DIAGNOSIS — E538 Deficiency of other specified B group vitamins: Secondary | ICD-10-CM | POA: Diagnosis not present
